# Patient Record
Sex: Male | Born: 2000 | Race: White | Hispanic: No | Marital: Single | Smoking: Never smoker
Health system: Southern US, Community
[De-identification: ages and names within clinical notes are randomized; demographics above are authoritative.]

## PROBLEM LIST (undated history)

## (undated) DIAGNOSIS — F32A Depression, unspecified: Secondary | ICD-10-CM

## (undated) DIAGNOSIS — R7303 Prediabetes: Secondary | ICD-10-CM

## (undated) DIAGNOSIS — F840 Autistic disorder: Secondary | ICD-10-CM

## (undated) DIAGNOSIS — F419 Anxiety disorder, unspecified: Secondary | ICD-10-CM

## (undated) DIAGNOSIS — R569 Unspecified convulsions: Secondary | ICD-10-CM

## (undated) HISTORY — DX: Depression, unspecified: F32.A

## (undated) HISTORY — DX: Anxiety disorder, unspecified: F41.9

## (undated) HISTORY — PX: TURBINATE REDUCTION: SHX6157

---
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
## 2019-12-19 ENCOUNTER — Other Ambulatory Visit: Payer: Self-pay

## 2019-12-19 ENCOUNTER — Emergency Department (HOSPITAL_BASED_OUTPATIENT_CLINIC_OR_DEPARTMENT_OTHER): Payer: Medicaid Other

## 2019-12-19 ENCOUNTER — Encounter (HOSPITAL_BASED_OUTPATIENT_CLINIC_OR_DEPARTMENT_OTHER): Payer: Self-pay

## 2019-12-19 ENCOUNTER — Emergency Department (HOSPITAL_BASED_OUTPATIENT_CLINIC_OR_DEPARTMENT_OTHER)
Admission: EM | Admit: 2019-12-19 | Discharge: 2019-12-19 | Disposition: A | Discharge status: Home / Self Care | Payer: Medicaid Other | Attending: Emergency Medicine | Admitting: Emergency Medicine

## 2019-12-19 DIAGNOSIS — Z79899 Other long term (current) drug therapy: Secondary | ICD-10-CM | POA: Insufficient documentation

## 2019-12-19 DIAGNOSIS — F840 Autistic disorder: Secondary | ICD-10-CM | POA: Insufficient documentation

## 2019-12-19 DIAGNOSIS — R000 Tachycardia, unspecified: Secondary | ICD-10-CM | POA: Insufficient documentation

## 2019-12-19 HISTORY — DX: Unspecified convulsions: R56.9

## 2019-12-19 HISTORY — DX: Prediabetes: R73.03

## 2019-12-19 HISTORY — DX: Autistic disorder: F84.0

## 2019-12-19 LAB — CBC WITH DIFFERENTIAL/PLATELET
Abs Immature Granulocytes: 0.01 10*3/uL (ref 0.00–0.07)
Basophils Absolute: 0 10*3/uL (ref 0.0–0.1)
Basophils Relative: 0 %
Eosinophils Absolute: 0.1 10*3/uL (ref 0.0–0.5)
Eosinophils Relative: 1 %
HCT: 44.3 % (ref 39.0–52.0)
Hemoglobin: 14.4 g/dL (ref 13.0–17.0)
Immature Granulocytes: 0 %
Lymphocytes Relative: 37 %
Lymphs Abs: 2.4 10*3/uL (ref 0.7–4.0)
MCH: 29.4 pg (ref 26.0–34.0)
MCHC: 32.5 g/dL (ref 30.0–36.0)
MCV: 90.4 fL (ref 80.0–100.0)
Monocytes Absolute: 0.5 10*3/uL (ref 0.1–1.0)
Monocytes Relative: 7 %
Neutro Abs: 3.5 10*3/uL (ref 1.7–7.7)
Neutrophils Relative %: 55 %
Platelets: 281 10*3/uL (ref 150–400)
RBC: 4.9 MIL/uL (ref 4.22–5.81)
RDW: 13.1 % (ref 11.5–15.5)
WBC: 6.5 10*3/uL (ref 4.0–10.5)
nRBC: 0 % (ref 0.0–0.2)

## 2019-12-19 LAB — COMPREHENSIVE METABOLIC PANEL
ALT: 27 U/L (ref 0–44)
AST: 17 U/L (ref 15–41)
Albumin: 4.4 g/dL (ref 3.5–5.0)
Alkaline Phosphatase: 72 U/L (ref 38–126)
Anion gap: 9 (ref 5–15)
BUN: 15 mg/dL (ref 6–20)
CO2: 23 mmol/L (ref 22–32)
Calcium: 8.7 mg/dL — ABNORMAL LOW (ref 8.9–10.3)
Chloride: 107 mmol/L (ref 98–111)
Creatinine, Ser: 1.04 mg/dL (ref 0.61–1.24)
GFR calc Af Amer: >60 mL/min (ref >60)
GFR calc non Af Amer: >60 mL/min (ref >60)
Glucose, Bld: 94 mg/dL (ref 70–99)
Potassium: 3.7 mmol/L (ref 3.5–5.1)
Sodium: 139 mmol/L (ref 135–145)
Total Bilirubin: 0.4 mg/dL (ref 0.3–1.2)
Total Protein: 7.3 g/dL (ref 6.5–8.1)

## 2019-12-19 LAB — TSH: TSH: 2.195 u[IU]/mL (ref 0.350–4.500)

## 2019-12-19 MED ORDER — MIDAZOLAM 5 MG/ML PEDIATRIC INJ FOR INTRANASAL/SUBLINGUAL USE
10.0000 mg | Freq: Once | INTRAMUSCULAR | Status: AC
  Administered 2019-12-19: 10 mg via NASAL
  Filled 2019-12-19 (×2): qty 2

## 2019-12-19 NOTE — Discharge Instructions (Signed)
Your work-up today was overall reassuring.  There were no significant lab abnormalities.  His TSH is pending, this can be checked via MyChart.  Please follow-up with cardiology for your son's tachycardia.  Return to the ER if his symptoms worsen.

## 2019-12-19 NOTE — ED Triage Notes (Addendum)
Per parents pt with "tachycardia" x 2 weeks-pt seen by PCP x once and virtual visit today and advised to come to ED-pt took propranolol 10mg  today at 745 with HR 118-pt NAD-steady gait-pt is autistic-parents states he can not answer if he is having pain

## 2019-12-19 NOTE — ED Provider Notes (Signed)
[facility] EMERGENCY DEPARTMENT Provider Note   CSN: [id number] Arrival date & time: 12/19/19  1121     History Chief Complaint  Patient presents with  . Tachycardia    Brian Huerta is a 19 y.o. male.  HPI  LEVEL 5 CAVEAT 2/2 TO AUTISM 19 year old male with a history of autism, seizures, prediabetes presents to the ER with episodes of tachycardia.  History provided by patient, patient is severely autistic, with behavioral levels of a 41-year-old per patient family.  They state that he has had intermittent episodes of tachycardia for the last 2 weeks.  At times, they believe that this is psychosomatic and is aggravated by his anger.  However, yesterday morning, the patient was calm and his father noted that he had an episode of tachycardia with his heart rate in the 120s.  He was nondiaphoretic, not short of breath, not ill-appearing at that time.  They followed up with their PCP, and were told to come to the ER for further work-up.  Patient has a prescription for propranolol, which she has been taking, and this has been helping improve his tachycardia.  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  He was not hospitalized for this, as the family is aware of his seizure history, and he did not lose consciousness.  He is followed by neurology, though he is in the middle of transitioning to another neurologist.  He is on Neurontin, Lamictal, and Topamax for seizures.  He also has a pending cardiology follow-up.  Family denies any fevers, nausea, vomiting, abdominal pain, diaphoresis, diarrhea, constipation.  The patient is severely autistic, and cannot often communicate how he feels however.  Patient has a history of aggression and is taking Zyprexa and Risperdal for this.    Past Medical History:  Diagnosis Date  . Autism   . Prediabetes   . Seizures  [facility])     Patient Active Problem List   Diagnosis Date Noted  . Undersocialized conduct disorder, aggressive type, unspecified 09/30/2012  . Autistic disorder, current or active state 09/30/2012  . Morbid obesity ([place]) 09/30/2012  . Intermittent explosive disorder 09/30/2012    Past Surgical History:  Procedure Laterality Date  . TURBINATE REDUCTION         No family history on file.  Social History   Tobacco Use  . Smoking status: Not on file  Substance Use Topics  . Alcohol use: Not on file  . Drug use: Not on file    Home Medications Prior to Admission medications   Medication Sig Start Date End Date Taking? Authorizing Provider  clomiPRAMINE (ANAFRANIL) 75 MG capsule TAKE 2 CAPSULES BY MOUTH ONCE DAILY IN THE MORNING AND 1 AT BEDTIME FOR SEVERE OCD 11/11/19   [provider]  cloNIDine (CATAPRES) 0.2 MG tablet Take 0.2 mg by mouth 2 (two) times daily. 11/11/19   [provider]  gabapentin (NEURONTIN) 600 MG tablet TAKE 1 TABLET BY MOUTH THREE TIMES DAILY (6 AM 10 AM AND 12 30 PM) FOR MOOD STABILIZATION 12/09/19   [provider]  lamoTRIgine (LAMICTAL) 25 MG tablet TAKE 1 TABLET BY MOUTH ONCE DAILY AT BEDTIME. INCREASE BY 1 TABLET WEEKLY UNTIL TAKING 4 TABLETS AT BEDTIME 11/21/19   [provider]  metFORMIN (GLUCOPHAGE) 500 MG tablet Take 500 mg by mouth daily. 11/11/19   [provider]  OLANZapine zydis (ZYPREXA) 10 MG disintegrating tablet Take 10 mg by mouth daily as needed. 11/14/19   [provider]  propranolol (INDERAL) 10 MG tablet Take 10 mg by mouth 2 (two) times daily as needed. 12/13/19   [provider]  risperiDONE (RISPERDAL) 3 MG tablet Take 3 mg by mouth 2 (two) times daily. 12/16/19   [provider]  topiramate (TOPAMAX) 200 MG tablet Take 200 mg by mouth 2 (two) times daily. 11/11/19   [provider]    Allergies    Patient has no known allergies.  Review of Systems     Review of Systems  Unable to perform ROS: Psychiatric disorder    Physical Exam Updated Vital Signs BP 123/86 (BP Location: Right Arm)   Pulse 93   Temp 98.3 F (36.8 C) (Tympanic)   Resp 16   Ht 5' 10.5" (1.791 m)   Wt 121.6 kg   SpO2 99%   BMI 37.91 kg/m   Physical Exam Vitals and nursing note reviewed.  Constitutional:      General: He is not in acute distress.    Appearance: Normal appearance. He is well-developed. He is not toxic-appearing or diaphoretic.  HENT:     Head: Normocephalic and atraumatic.     Nose: Nose normal.     Mouth/Throat:     Mouth: Mucous membranes are moist.     Pharynx: Oropharynx is clear.  Eyes:     Conjunctiva/sclera: Conjunctivae normal.     Pupils: Pupils are equal, round, and reactive to light.  Cardiovascular:     Rate and Rhythm: Normal rate and regular rhythm.     Pulses: Normal pulses.     Heart sounds: Normal heart sounds. No murmur.  Pulmonary:     Effort: Pulmonary effort is normal. No respiratory distress.     Breath sounds: Normal breath sounds.  Abdominal:     General: Abdomen is flat.     Palpations: Abdomen is soft.     Tenderness: There is no abdominal tenderness.  Musculoskeletal:        General: No tenderness or signs of injury. Normal range of motion.     Cervical back: Normal range of motion and neck supple. No rigidity or tenderness.  Skin:    General: Skin is warm and dry.  Neurological:     General: No focal deficit present.     Mental Status: He is alert and oriented to person, place, and time.     Sensory: No sensory deficit.     Motor: No weakness.  Psychiatric:        Mood and Affect: Mood normal.        Behavior: Behavior normal.     ED Results / Procedures / Treatments   Labs (all labs ordered are listed, but only abnormal results are displayed) Labs Reviewed  COMPREHENSIVE METABOLIC PANEL - Abnormal; Notable for the following components:      Result Value   Calcium 8.7 (*)    All other  components within normal limits  CBC WITH DIFFERENTIAL/PLATELET  TSH    EKG EKG Interpretation  Date/Time:  Thursday December 19 2019 11:31:20 EDT Ventricular Rate:  108 PR Interval:  138 QRS Duration: 80 QT Interval:  322 QTC Calculation: 431 R Axis:   32 Text Interpretation: Sinus tachycardia Otherwise normal ECG NO prior ECG for comparison. No STEMI Confirmed by Theda Belfast ([id number]) on 12/19/2019 1:45:35 PM   Radiology DG Chest [facility]  Result Date: 12/19/2019 CLINICAL DATA:  Tachycardia EXAM: PORTABLE CHEST 1 VIEW COMPARISON:  None. FINDINGS: The heart size and mediastinal contours are within normal limits. Both lungs are clear. The visualized skeletal structures are unremarkable. IMPRESSION: Normal study. Electronically Signed   By: Charlett Nose M.D.   On: 12/19/2019 17:42    Procedures Procedures (including critical care time)  Medications Ordered in ED Medications  midazolam (VERSED) 5 mg/ml Pediatric INJ for INTRANASAL Use (10 mg Nasal Given 12/19/19 1647)    ED Course  I have reviewed the triage vital signs and the nursing notes.  Pertinent labs & imaging results that were available during my care of the patient were reviewed by me and considered in my medical decision making (see chart for details).    MDM Rules/Calculators/A&P                     19 year old autistic male with intermittent tachycardia over the last 2 weeks. On presentation to the ER, the patient is alert and oriented, nontoxic-appearing, resting comfortably in the ER bed.  He is able to utter sporadic random words without increased work of breathing.  He has nondiaphoretic.  Vitals are overall reassuring, though while I was in the room, his heart rate does fluctuate from the 80s to the upper 120s, depending if he gets agitated or not.  Physical exam without abnormalities, no murmur appreciated.  The patient was sent here from primary care for further work-up.  I was informed by the nursing staff and  the patient's family that he becomes extremely combative with any attempts at getting lab work.  The patient's EKG on presentation showed normal sinus tach.  He is overall not ill-appearing, vitals are reassuring, EKG without ST abnormalities or evidence of ischemia.  We discussed at length about the risks and benefits of sedation in order to procure blood work from the patient.  I explained to the patient's family that we generally do not sedate for routine lab work in the ER.  The patient's father grew increasingly frustrated, and stated that he does not know where else he can get this blood work done.  He states that his PCP has refused to do lab work since the patient has a history of getting very violent.  He expresses frustrations over not knowing how to be able to get both blood work done for his son as has only had labs drawn once in his lifetime.   They are concerned about electrolyte abnormalities, or a thyroid issue as his mother has a history of this. His father related that the patient had been sedated once for tonsillectomy and adenoidectomy in July 2019.  Per chart review, patient received Versed then.   I relayed the message to Dr. Rush Landmark, who also consulted with Dr. Fredderick Phenix as this was at shift change.  They spoke with the patient's family at length, and decided to attempt to sedate him with Versed in the ER to procure lab work.  With this was successful, we were able to draw CBC, CMP, and TSH.  CBC without leukocytosis, normal hemoglobin.  CMP without significant electrolyte abnormalities, normal creatinine.  Normal AST ALT.  His TSH is pending.  Chest x-ray without acute abnormalities.  Overall his work-up is reassuring.  I informed the patient's of the reassuring lab work, and they are overall reassured by the work-up.  I encouraged them to keep their follow-up with the neurologist and cardiologist, follow-up with his PCP as well.  Encouraged to continue to take propranolol.  All of the patient's  parents questions have been answered to their satisfaction.  At this stage in the ED course, his work-up has been fairly benign.  I suspect his tachycardia could be secondary to agitation.  However, he will still need to follow-up with cardiology.  At this stage in the ED course, the patient is appropriately medically screened and are stable for discharge.  The patient was seen and evaluated by Dr. Rush Landmark and Dr. Fredderick Phenix and they are agreeable to the above plan.  Final Clinical Impression(s) / ED Diagnoses Final diagnoses:  Tachycardia    Rx / DC Orders ED Discharge Orders    None       Leone Brand 12/19/19 1838    Tegeler, Canary Brim, MD 12/19/19 2110

## 2019-12-27 ENCOUNTER — Encounter: Payer: Self-pay | Admitting: Cardiology

## 2019-12-27 ENCOUNTER — Ambulatory Visit: Payer: Medicaid Other | Admitting: Cardiology

## 2019-12-27 ENCOUNTER — Other Ambulatory Visit: Payer: Self-pay

## 2019-12-27 VITALS — BP 124/73 | HR 118 | Resp 17 | Ht 70.0 in | Wt 258.0 lb

## 2019-12-27 DIAGNOSIS — R000 Tachycardia, unspecified: Secondary | ICD-10-CM

## 2019-12-27 NOTE — Progress Notes (Signed)
Patient referred by Trey Sailors, PA for tachycrdia  Subjective:   Brian Huerta, male    DOB: May 07, 2001, 19 y.o.   MRN: [id number]   Chief Complaint  Patient presents with  . Tachycardia  . New Patient (Initial Visit)     HPI  19 y.o. Caucasian male with autism, mood disorders, recurrent seizures, referred for evaluation of tachycardia.  Patient is here with his father.  He lives with his father and his wife, with his biological mother lives separately.  He visits his mother once every other weekend.  He has been out of school throughout the pandemic, and is only resuming session next week at an inclusive school.  Throughout the pandemic, his father tells me that patient has had significant separation anxiety from his mother.  Throughout the visit today, he matters " my mom" several times.  In the last few weeks, he has had recurrent seizure episodes that have not completely resolved in spite of starting lamotrigine.  Father thinks that lamotrigine has led to more anxiety.  Father has not noted any significant change in his baseline physical activity, and has not noticed any significant shortness of breath.  He does have attacks where he becomes cold and clammy and very anxious, but his heart rate shoots up further.  Has not had any presyncope or syncope episode.  At night, his heart rate is down to 60 bpm, as measured on pulse oximeter.  He has not had any hypoxia.  Currently, he is prescribed propranolol 10 mg as needed, which he generally takes once a day.  He also takes clonidine 0.2 mg 1-2 times at night, as prescribed by his psychiatrist, which helps him stay calm and sleep better.  Father is concerned about myocarditis, as he thinks his symptoms have occurred more after his second [place] vaccine  Past Medical History:  Diagnosis Date  . Autism   . Prediabetes   . Seizures ([place])      Past Surgical History:  Procedure Laterality Date  . TURBINATE REDUCTION         Social History   Tobacco Use  Smoking Status Never Smoker  Smokeless Tobacco Never Used    Social History   Substance and Sexual Activity  Alcohol Use Not Currently     Family History  Problem Relation Age of Onset  . Hypothyroidism Mother   . Sleep apnea Father      Current Outpatient Medications on File Prior to Visit  Medication Sig Dispense Refill  . clomiPRAMINE (ANAFRANIL) 75 MG capsule TAKE 2 CAPSULES BY MOUTH ONCE DAILY IN THE MORNING AND 1 AT BEDTIME FOR SEVERE OCD    . cloNIDine (CATAPRES) 0.2 MG tablet Take 0.2 mg by mouth 2 (two) times daily.    Marland Kitchen gabapentin (NEURONTIN) 600 MG tablet TAKE 1 TABLET BY MOUTH THREE TIMES DAILY (6 AM 10 AM AND 12 30 PM) FOR MOOD STABILIZATION    . lamoTRIgine (LAMICTAL) 25 MG tablet TAKE 1 TABLET BY MOUTH ONCE DAILY AT BEDTIME. INCREASE BY 1 TABLET WEEKLY UNTIL TAKING 4 TABLETS AT BEDTIME    . metFORMIN (GLUCOPHAGE) 500 MG tablet Take 500 mg by mouth daily.    Marland Kitchen OLANZapine zydis (ZYPREXA) 10 MG disintegrating tablet Take 10 mg by mouth daily as needed.    . propranolol (INDERAL) 10 MG tablet Take 10 mg by mouth 2 (two) times daily as needed.    . risperiDONE (RISPERDAL) 3 MG tablet Take 3 mg by mouth 2 (  two) times daily.    Marland Kitchen topiramate (TOPAMAX) 200 MG tablet Take 200 mg by mouth 2 (two) times daily.     No current facility-administered medications on file prior to visit.    Cardiovascular and other pertinent studies:   EKG 12/19/2019: Sinus tachycardia 108 bpm. Otherwise normal EKG   Recent labs: 12/19/2019: Glucose 94, BUN/Cr 15/1.04. EGFR >60. Na/K 139/3.7. Rest of the CMP normal H/H 14/44. MCV 90. Platelets 281 HbA1C N/A% TSH 2.1 normal   Review of Systems  Unable to perform ROS: patient nonverbal         Vitals:   12/27/19 0917 12/27/19 0918  BP:    Pulse:    Resp:    SpO2: 97% 99%   Orthostatic VS for the past 72 hrs (Last 3 readings):  Orthostatic BP Patient Position BP Location Cuff Size  Orthostatic Pulse  12/27/19 0918 112/72 Standing Left Arm Large 121  12/27/19 0917 120/79 Sitting Left Arm Large 105  12/27/19 0916 105/76 Supine Left Arm Large 105     Body mass index is 37.02 kg/m. Filed Weights   12/27/19 0911  Weight: 258 lb (117 kg)     Objective:   Physical Exam Vitals and nursing note reviewed.  Constitutional:      General: He is not in acute distress. Neck:     Vascular: No JVD.  Cardiovascular:     Rate and Rhythm: Regular rhythm. Tachycardia present.     Pulses: Intact distal pulses.     Heart sounds: Normal heart sounds. No murmur heard.   Pulmonary:     Effort: Pulmonary effort is normal.     Breath sounds: Normal breath sounds. No wheezing or rales.          Assessment & Recommendations:   19 y.o. Caucasian male with autism, mood disorders, recurrent seizures, referred for evaluation of tachycardia.  Tachycardia: Normal physical exam, other than resting tachycardia.  Normal EKG.  No signs/symptoms to suggest congestive heart failure, myocarditis, pericarditis. I am convinced that his tachycardia is due to underlying anxiety, which has only worsened throughout the pandemic.  I do not think any cardiac testing will provide [place] any more meaningful information without causing patient more stress and discomfort. I recommend taking propranolol 10 mg 2-3 times daily to improve his resting inappropriate sinus tachycardia, rising from anxiety.  I do not think this will cause any significant drop in his blood pressure.  I will convey my recommendations to the patient's PCP, with whom he has a visit later this afternoon.  For the propranolol prescriptions can be managed by PCP.  Patient's father is reassured with my recommendation, and agrees with no further cardiac testing at this time.   I will see him on as-needed basis.  Time spent reviewing prior records, consultation and counseling, and charting: 60 min   Thank you for referring the  patient to [place]. Please feel free to contact with any questions.  Nigel Mormon, MD [facility]. [facility] Pager: [phone] Office: [phone]

## 2019-12-30 ENCOUNTER — Telehealth (INDEPENDENT_AMBULATORY_CARE_PROVIDER_SITE_OTHER): Payer: Self-pay | Admitting: Pediatrics

## 2019-12-30 NOTE — Telephone Encounter (Signed)
  Who's calling (name and relationship to patient) :Dad/ Allena Earing   Best contact number:[phone]  Provider they see:  Reason for call:Dad is requesting for Momin to see Dr. Artis Flock. The pt has seen Dr. Sharene Skeans and Inetta Fermo before but it has been 7 years. Dad also has other concerns about why he wants the pt do be seen by Dr. Artis Flock. Pt is 19 years old      PRESCRIPTION REFILL ONLY  Name of prescription:  Pharmacy:

## 2019-12-30 NOTE — Telephone Encounter (Signed)
Patient chart reviewed and patient discussed with Brian Huerta, clinical manager.  Will discuss appropriateness with clinic protocol on Thursday and return message to family afterwards.   Lorenz Coaster mD MPH

## 2020-01-08 NOTE — Telephone Encounter (Signed)
Patient discussed at meeting, agreed to see him as new patient.   Lorenz Coaster MD MPH

## 2020-01-28 ENCOUNTER — Telehealth (INDEPENDENT_AMBULATORY_CARE_PROVIDER_SITE_OTHER): Payer: Self-pay | Admitting: Pediatrics

## 2020-01-28 NOTE — Telephone Encounter (Signed)
Would you like them to contact their PCP since they have not been seen yet?

## 2020-01-28 NOTE — Telephone Encounter (Signed)
  Who's calling (name and relationship to patient) : Allena Earing (father)  Best contact number: [phone]  Provider they see: Dr. Artis Flock  Reason for call: Patient has his first appointment scheduled with Dr. Artis Flock on 7/28. Dad is calling today because patient had a significant grand mal seizure yesterday and dad is wanting advice on what to do. Also curious about seizure medication that comes in nasal spray form. Requests call back.    PRESCRIPTION REFILL ONLY  Name of prescription:  Pharmacy:

## 2020-01-29 NOTE — Telephone Encounter (Signed)
I can not give medical advice given he is not established with [place].  It appears he has a current neurologist through [facility], I recommend reaching out to them until he has established care with me.   Lorenz Coaster MD MPH

## 2020-01-29 NOTE — Telephone Encounter (Signed)
I called patient's father to relay message from Dr. Artis Flock. He stated that he understood the reasoning, however, he was not going to call [facility] neurology as it would be taking steps back in Andreas's care. He feels he did not receive appropriate care at [facility] and the prescribing of lamotrigine as he believes it caused it psychosomatic reaction causing his anxiety and irritability to increase. He has made attempts to call PCP and they refer him back to "seeing a neurologist" and state that they are not equipped to take care of Doyel's neurological issues.   Father reports hope in [place] not having more seizures from now until appointment date. He believes that seizure activity has increased as of April 17th when [facility] had his second Covid-19 [place]) vaccine.   He states that the seizure Deante had on Monday lasted from 11:55am-12:02pm. There were no rescue medications given as he does not have any prescribed to him. Kyland was unconscious and cyanosis of the lips (this has only ever happened one other time) for about one minute after seizure activity stopped. Father states that what scared him was that post seizure Karol staggered while walking, which he normally does not do, he also fell asleep for longer than usual. While sleeping father noticed him having "petite mal seizures," side to side eye movements, and muscle spasms. He slept until about 3pm that day.   I let father know I would reach out to him if there were any available slots that came up tomorrow or Friday, or if Dr. Artis Flock could fit him in but I could not guarantee this. I invited him to call me back if there was further seizure activity so that I could have documentation of this. Father verbalized agreement and understanding.

## 2020-01-29 NOTE — Telephone Encounter (Signed)
I can not give medical advice if he hasn't established with me, however I can add him on to my schedule tomorrow at 9am if father can do it. I'm out of town next week, so unfortunately if 9am tomorrow does not work, he will have to wait until 7/28.   Lorenz Coaster MD MPH

## 2020-01-30 NOTE — Telephone Encounter (Signed)
Patient scheduled on 7/19 with Elveria Rising, FNP

## 2020-02-03 ENCOUNTER — Encounter (INDEPENDENT_AMBULATORY_CARE_PROVIDER_SITE_OTHER): Payer: Self-pay | Admitting: Family

## 2020-02-03 ENCOUNTER — Ambulatory Visit (INDEPENDENT_AMBULATORY_CARE_PROVIDER_SITE_OTHER): Payer: Medicaid Other | Admitting: Family

## 2020-02-03 ENCOUNTER — Other Ambulatory Visit: Payer: Self-pay

## 2020-02-03 VITALS — BP 110/68 | Ht 70.67 in | Wt 262.5 lb

## 2020-02-03 DIAGNOSIS — R569 Unspecified convulsions: Secondary | ICD-10-CM | POA: Diagnosis not present

## 2020-02-03 MED ORDER — VALTOCO 20 MG DOSE 10 MG/0.1ML NA LQPK: NASAL | 1 refills | Status: DC

## 2020-02-03 NOTE — Patient Instructions (Signed)
Thank you for coming in today.   Instructions for you until your next appointment are as follows: 1. Please try to video any seizures Brian Huerta may have 2. If he has a seizure lasting 2 minutes or longer, give Valtoco nasal spray. He should receive 1 spray into each nostril. The package comes with 2 atomizers in order to give the sprays.  3. Brian Huerta may be tired or sleepy after the Valtoco. It is ok to let him sleep.  4. He may have a headache after a seizure. It is ok to give him Tylenol or Motrin after a seizure occurs 5. I have completed a school form for the Valtoco.  6. If you or the school has to give the medication, please let me know.  7. Please keep the appointment with Dr Artis Flock next week 8. Please sign up for MyChart if you have not done so

## 2020-02-04 ENCOUNTER — Telehealth (INDEPENDENT_AMBULATORY_CARE_PROVIDER_SITE_OTHER): Payer: Self-pay | Admitting: Family

## 2020-02-04 ENCOUNTER — Encounter (INDEPENDENT_AMBULATORY_CARE_PROVIDER_SITE_OTHER): Payer: Self-pay | Admitting: Family

## 2020-02-04 NOTE — Progress Notes (Signed)
Brian Huerta   MRN:  [id number]  10-Nov-2000   Provider: Elveria Rising NP-C Location of Care: [facility]  Visit type: New patient   Referral source: Brian Riffle, PA/Brian Osei-Bonsu, MD History from: Patient's parents  Brief history:  Brian Huerta has history of autism, problems with behaviors, morbid obesity and seizure disorder. He was seen as a young child by me and by Brian Huerta but those records are in storage and not available to me at this time. He receives psychiatric care from Brian Huerta. Parents report that he is taking Topiramate 200mg  twice per day for weight loss and presumed "petit mal" seizures in which he stares, as random eye movements and he is dazed and angry afterwards. He taking Gabapentin 600mg  three times per day for behavior and seizures. He was started on Lamotrigine in May 2021 by Brian at [facility], but the dose has been reduced to 25mg  at bedtime because of increased anxiety since the medication was started. Parents report general increase in anxiety and panic behaviors. He was seen in the ER on December 19, 2019 for tachycardia thought to be secondary to agitation and then seen in follow up by cardiology. Propranolol was started at 10mg  three times per day for this problem. Parents report that he has ongoing problems with oppositional behavior, intermittent explosive behavior, compulsive behavior and intermittent aggressive behaviors.   Brian Huerta also takes Clomipramine and Risperidone for mood disorder and behaviors, Clonidine for insomnia and Metformin for concern for pre-diabetes.   Brian Huerta is seen today because of increase in convulsive seizures. Parents report that the first seizure occurred in 2019 when he had a 30 second generalized seizure in school. He did well until April 2021 when he was found on the floor and had a 2 minute convulsive seizure with 30 seconds of apnea. A third seizure occurred on Nov 21, 2019.  This occurred in the late afternoon, and lasted 3-4 minutes with 45 seconds of apnea and cyanosis to the lips. Of note it was the first day of in classroom school after the Covid 19 pandemic restrictions, and he also seemed overheated at the time of the seizure.   The next seizure occurred December 17, 2019. He was walking at school when he had seizure lasting 2 minutes 45 seconds. He had been suffering with increased anxiety and panic, that his parents felt were worsened by Lamotrigine.   The most recent seizure occurred January 27, 2020 when he had a 6 minute convulsive seizure at school. He was sitting at the time and was not injured with the seizure. He had perioral cyanosis with this seizure. Afterwards he slept excessively for 1+1/2 days, would flinch when his body was touched and placed his hand to his head indicating a headache.   Brian Huerta is largely non-verbal but has some signs that he uses to communicate when prompted. He is morbidly obese but parents note that he has lost about 20 lbs since being on Topiramate. He is a generally picky eater but has been consuming a more varied diet as he gets older. His parents say that he drinks water every day.   Parents report that Brian Huerta did well with remote school last year. His parents have shared custody and report that his behaviors also improved when he had more time with Mom due to remote learning.  Parents are concerned about the increase in seizures and are interested in an abortive treatment. He has an appointment with Brian  Brian Huerta next week to establish neurology care. He has not had an EEG performed as he is unable to cooperate with the procedure.  Brian Huerta has been otherwise generally healthy. Parents have no other health concerns for him today other than previously mentioned.  Review of systems: Please see HPI for neurologic and other pertinent review of systems. Otherwise all other systems were reviewed and were negative.  Problem List: Patient  Active Problem List   Diagnosis Date Noted   Undersocialized conduct disorder, aggressive type, unspecified 09/30/2012   Autistic disorder, current or active state 09/30/2012   Morbid obesity (HCC) 09/30/2012   Intermittent explosive disorder 09/30/2012     Past Medical History:  Diagnosis Date   Anxiety    Phreesia 02/02/2020   Autism    Depression    Phreesia 02/02/2020   Prediabetes    Seizures (HCC)     Past medical history comments: See HPI  Surgical history: Past Surgical History:  Procedure Laterality Date   TURBINATE REDUCTION       Family history: family history includes Hypothyroidism in his mother; Sleep apnea in his father.   Social history: Social History   Socioeconomic History   Marital status: Single    Spouse name: Not on file   Number of children: 0   Years of education: Not on file   Highest education level: Not on file  Occupational History   Not on file  Tobacco Use   Smoking status: Passive Smoke Exposure - Never Smoker   Smokeless tobacco: Never Used  Vaping Use   Vaping Use: Never used  Substance and Sexual Activity   Alcohol use: Not Currently   Drug use: Not Currently   Sexual activity: Not on file  Other Topics Concern   Not on file  Social History Narrative   Brian Huerta, is currently in school at [employer]. He is currently on the completion track. He lives with Dad during the school year. He has 1 older brother.   Social Determinants of Health   Financial Resource Strain:    Difficulty of Paying Living Expenses:   Food Insecurity:    Worried About Programme researcher, broadcasting/film/video in the Last Year:    Barista in the Last Year:   Transportation Needs:    Freight forwarder (Medical):    Lack of Transportation (Non-Medical):   Physical Activity:    Days of Exercise per Week:    Minutes of Exercise per Session:   Stress:    Feeling of Stress :   Social Connections:    Frequency of Communication  with Friends and Family:    Frequency of Social Gatherings with Friends and Family:    Attends Religious Services:    Active Member of Clubs or Organizations:    Attends Engineer, structural:    Marital Status:   Intimate Partner Violence:    Fear of Current or Ex-Partner:    Emotionally Abused:    Physically Abused:    Sexually Abused:      Past/failed meds: Lamotrigine - increased anxiety Hydroxyzine - paradoxical effect Olanzapine - paradoxical effect  Allergies: No Known Allergies    Immunizations:  There is no immunization history on file for this patient.    Diagnostics/Screenings:   Physical Exam: BP 110/68    Ht 5' 10.67" (1.795 m)    Wt 262 lb 8 oz (119.1 kg)    HC 62" (157.5 cm)    BMI 36.96 kg/m   General:  well developed, well nourished obese young man, pacing in exam room, in no evident distress Head: normocephalic and atraumatic. Oropharynx examination deferred due to patient's inability to cooperate. No dysmorphic features. Neck: supple Cardiovascular: regular rate and rhythm, no murmurs. Respiratory: Clear to auscultation bilaterally Abdomen: Bowel sounds present all four quadrants, abdomen soft, non-tender, non-distended.  Musculoskeletal: No skeletal deformities or obvious scoliosis Skin: no rashes or neurocutaneous lesions  Neurologic Exam Mental Status: Awake and fully alert.  Attention span, concentration, and fund of knowledge subnormal for age.  Has no language other than saying "mama". He signed "thank you" to me with parent's prompting. Had occasionally vocalizations. Able to follow a very few basic commands. Restless and pacing in the exam room for most of the visit.  Cranial Nerves: Fundoscopic exam - red reflex present.  Unable to fully visualize fundus.  Pupils equal briskly reactive to light.  Extraocular movements appear full without nystagmus. Hearing intact and symmetric to mother's whisper.  Facial sensation intact.  Face,  tongue, palate move normally and symmetrically.  Neck flexion and extension normal. Motor: Normal functional bulk, tone and strength Sensory: Intact to touch and temperature in all extremities. Coordination: Unable to adequately assess due to patient's inability to cooperate with examination. No dysmetria when reaching for objects. Balance is adequate.  Gait and Station: Arises from chair, without difficulty. Stance is normal.  Gait demonstrates wide based stride length and balance.  Reflexes: Unable to adequately assess due to patient's inability to cooperate.  Impression: 1. Autism 2. Mood disorder and problems with behavior 3. Seizure disorder 4. Morbid obesity  Recommendations for plan of care: [facility]. Olena LeatherwoodJarrett is a 19 year old man with history of autism, mood disorder and problems with behavior, seizure disorder and morbid obesity. He was referred for evaluation and management of seizures. Olena LeatherwoodJarrett has been prescribed Topiramate for weight loss and Gabapentin for behavior, and parents presume that they are also utilized for the seizure disorder as they report "petit mal" seizures for several years. Olena LeatherwoodJarrett was prescribed Lamotrigine in May 2021 by Brian Alton [facility]aru [facility] but his parents report that this medication worsened anxiety and panic behaviors. He has had total of 6 witnessed convulsive seizures, with the first occurring in 2019 and the last 5 occurring between April and July of this year. His parents are appropriately concerned about the increase in seizures and are interested in abortive treatment. I reviewed options for abortive treatment such as rectal Diastat, Valtoco nasal spray and Nayzilam nasal spray. The rectal Diastat would be very difficult in view of his morbid obesity and getting him in to position to administer the medication. After discussion, his parents agreed to trial of Valtoco nasal spray. I reviewed the medication with his parents  and demonstrated how to administer it. I completed a school medication form for Olena LeatherwoodJarrett to receive the medication at school when seizures occur. I asked his parents to try to video any seizure events as it is unlikely that he would be able to tolerate an EEG.  I reviewed common seizure triggers such as sleep deprivation and emotional upset as well as seizure first aid and treatment after a seizure occurs. Olena LeatherwoodJarrett has an appointment next week with Brian Lorenz CoasterStephanie Brian Huerta for further evaluation and I encouraged his parents to keep that appointment. Parents agreed with plans made today.   The medication list was reviewed and reconciled. I reviewed changes that were made in the prescribed medications today. A complete medication list  was provided to the patient.  Allergies as of 02/03/2020   No Known Allergies     Medication List       Accurate as of February 03, 2020 11:59 PM. If you have any questions, ask your nurse or doctor.        clomiPRAMINE 75 MG capsule Commonly known as: ANAFRANIL TAKE 2 CAPSULES BY MOUTH ONCE DAILY IN THE MORNING AND 1 AT BEDTIME FOR SEVERE OCD   cloNIDine 0.2 MG tablet Commonly known as: CATAPRES Take 0.2 mg by mouth 2 (two) times daily.   gabapentin 600 MG tablet Commonly known as: NEURONTIN TAKE 1 TABLET BY MOUTH THREE TIMES DAILY (6 AM 10 AM AND 12 30 PM) FOR MOOD STABILIZATION   lamoTRIgine 25 MG tablet Commonly known as: LAMICTAL TAKE 1 TABLET BY MOUTH ONCE DAILY AT BEDTIME. INCREASE BY 1 TABLET WEEKLY UNTIL TAKING 4 TABLETS AT BEDTIME   metFORMIN 500 MG tablet Commonly known as: GLUCOPHAGE Take 500 mg by mouth daily.   OLANZapine zydis 10 MG disintegrating tablet Commonly known as: ZYPREXA Take 10 mg by mouth daily as needed.   propranolol 10 MG tablet Commonly known as: INDERAL Take 10 mg by mouth 2 (two) times daily as needed.   risperiDONE 3 MG tablet Commonly known as: RISPERDAL Take 3 mg by mouth 2 (two) times daily.   topiramate 200 MG  tablet Commonly known as: TOPAMAX Take 200 mg by mouth 2 (two) times daily.   Valtoco 20 MG Dose 2 x 10 MG/0.1ML Lqpk Generic drug: diazePAM (20 MG Dose) Place 1 spray into each nostril for seizure lasting more than 2 minutes Started by: Brian Rising, NP       Total time spent with the patient was 35 minutes, of which 50% or more was spent in counseling and coordination of care.  Brian Rising NP-C [facility] Ph. [phone] Fax [phone]

## 2020-02-04 NOTE — Telephone Encounter (Signed)
I called the pharmacy to let them know that the Valtoco was approved by Medicaid. The pharmacy had been running it as 10mg  dose when it is 20mg  dose. The pharmacy said that it went through as 20mg  dose. I called Dad to let him know. TG

## 2020-02-04 NOTE — Telephone Encounter (Signed)
Who's calling (name and relationship to patient) : Renae Fickle [name] dad  Best contact number: [phone]  Provider they see: Elveria Rising  Reason for call: Dad called stating that the insurance would not cover valtoco They would cover a generic though  Call ID:      PRESCRIPTION REFILL ONLY  Name of prescription:  Pharmacy:

## 2020-02-12 ENCOUNTER — Ambulatory Visit (INDEPENDENT_AMBULATORY_CARE_PROVIDER_SITE_OTHER): Payer: Medicaid Other | Admitting: Pediatrics

## 2020-02-12 ENCOUNTER — Encounter (INDEPENDENT_AMBULATORY_CARE_PROVIDER_SITE_OTHER): Payer: Self-pay | Admitting: Pediatrics

## 2020-02-12 VITALS — BP 108/62 | HR 80 | Ht 70.0 in | Wt 262.0 lb

## 2020-02-12 DIAGNOSIS — F840 Autistic disorder: Secondary | ICD-10-CM

## 2020-02-12 DIAGNOSIS — R569 Unspecified convulsions: Secondary | ICD-10-CM

## 2020-02-12 MED ORDER — LACOSAMIDE 100 MG PO TABS: 100.0000 mg | ORAL_TABLET | Freq: Two times a day (BID) | ORAL | 3 refills | Status: DC

## 2020-02-12 MED ORDER — TOPIRAMATE 200 MG PO TABS: 200.0000 mg | ORAL_TABLET | Freq: Two times a day (BID) | ORAL | 3 refills | Status: DC

## 2020-02-12 MED ORDER — GABAPENTIN 600 MG PO TABS: 600.0000 mg | ORAL_TABLET | Freq: Four times a day (QID) | ORAL | 3 refills | Status: DC

## 2020-02-12 NOTE — Patient Instructions (Addendum)
Continue Topamax, I have taken over the prescriptions  Increase gabapentin to 600mg  4 times daily, every day  Start Vimpat (lamotrigine) 100mg  twice daily once you are comfortable with increased gabapentin (1-2 weeks)   Please sign release of information for Dr .  I will contact him.    Follow-up in 1 month Lacosamide tablets What is this medicine? LACOSAMIDE (la KOE sa mide) is used to control seizures caused by certain types of epilepsy. This medicine may be used for other purposes; ask your health care provider or pharmacist if you have questions. COMMON BRAND NAME(S): Vimpat What should I tell my health care provider before I take this medicine? They need to know if you have any of these conditions:  drug abuse or addiction  heart disease  kidney disease  liver disease  suicidal thoughts, plans, or attempt; a previous suicide attempt by you or a family member  an unusual or allergic reaction to lacosamide, other medicines, foods, dyes, or preservatives  pregnant or trying to get pregnant  breast-feeding How should I use this medicine? Take this medicine by mouth with a glass of water. Follow the directions on the prescription label. Do not cut, crush or chew this medicine. Swallow tablets whole. You can take it with or without food. If it upsets your stomach, take it with food. Take your medicine at regular intervals. Do not take it more often than directed. Do not stop taking except on your doctor's advice. A special MedGuide will be given to you by the pharmacist with each prescription and refill. Be sure to read this information carefully each time. Talk to your pediatrician regarding the use of this medicine in children. While this drug may be prescribed for children as young as 78 years of age for selected conditions, precautions do apply. Overdosage: If you think you have taken too much of this medicine contact a poison control center or emergency room at  once. NOTE: This medicine is only for you. Do not share this medicine with others. What if I miss a dose? If you miss a dose, take it as soon as you can. If it is almost time for your next dose, take only that dose. Do not take double or extra doses. What may interact with this medicine? This medicine may interact with the following medications:  atazanavir  beta-blockers like metoprolol and propranolol  calcium channel blockers like diltiazem and verapamil  certain medicines for irregular heart beat like amiodarone, bepridil, dofetilide, encainide, flecainide, propafenone, quinidine  certain medicines for seizures like carbamazepine, phenytoin  digoxin  dronedarone  lopinavir/ritonavir This list may not describe all possible interactions. Give your health care provider a list of all the medicines, herbs, non-prescription drugs, or dietary supplements you use. Also tell them if you smoke, drink alcohol, or use illegal drugs. Some items may interact with your medicine. What should I watch for while using this medicine? Visit your doctor or health care provider for regular checks on your progress. This medicine needs careful monitoring. This medicine may cause serious skin reactions. They can happen weeks to months after starting the medicine. Contact your health care provider right away if you notice fevers or flu-like symptoms with a rash. The rash may be red or purple and then turn into blisters or peeling of the skin. Or, you might notice a red rash with swelling of the face, lips or lymph nodes in your neck or under your arms. Wear a medical ID bracelet or chain, and  carry a card that describes your disease and details of your medicine and dosage times. You may get drowsy or dizzy. Do not drive, use machinery, or do anything that needs mental alertness until you know how this medicine affects you. Do not stand or sit up quickly, especially if you are an older patient. This reduces the  risk of dizzy or fainting spells. Alcohol may interfere with the effect of this medicine. Avoid alcoholic drinks. The use of this medicine may increase the chance of suicidal thoughts or actions. Pay special attention to how you are responding while on this medicine. Any worsening of mood, or thoughts of suicide or dying should be reported to your health care provider right away. Women who become pregnant while using this medicine may enroll in the [place] American Antiepileptic Drug Pregnancy Registry by calling 501-395-1994. This registry collects information about the safety of antiepileptic drug use during pregnancy. What side effects may I notice from receiving this medicine? Side effects that you should report to your doctor or health care professional as soon as possible:  allergic reactions like skin rash, itching or hives, swelling of the face, lips, or tongue  rash, fever, and swollen lymph nodes  signs and symptoms of a dangerous change in heartbeat or heart rhythm like chest pain; dizziness; fast, irregular heartbeat; palpitations; feeling faint or lightheaded; falls; breathing problems  signs and symptoms of liver injury like dark yellow or brown urine; general ill feeling or flu-like symptoms; light-colored stools; loss of appetite; nausea; right upper belly pain; unusually weak or tired; yellowing of the eyes or skin  suicidal thoughts, mood changes Side effects that usually do not require medical attention (report to your doctor or health care professional if they continue or are bothersome):  blurred vision  dizziness  drowsiness  headache  nausea This list may not describe all possible side effects. Call your doctor for medical advice about side effects. You may report side effects to FDA at 1-800-FDA-1088. Where should I keep my medicine? Keep out of the reach of children. This medicine can be abused. Keep your medicine in a safe place to protect it from theft. Do not  share this medicine with anyone. Selling or giving away this medicine is dangerous and against the law. This medicine may cause harm and death if it is taken by other adults, children, or pets. Return medicine that has not been used to an official disposal site. Contact the DEA at 937-134-7649 or your city/county government to find a site. If you cannot return the medicine, mix any unused medicine with a substance like cat litter or coffee grounds. Then throw the medicine away in a sealed container like a sealed bag or coffee can with a lid. Do not use the medicine after the expiration date. Store at room temperature between 15 and 30 degrees C (59 and 86 degrees F). NOTE: This sheet is a summary. It may not cover all possible information. If you have questions about this medicine, talk to your doctor, pharmacist, or health care provider.  2020 Elsevier/Gold Standard (2018-10-05 14:55:28)

## 2020-02-12 NOTE — Progress Notes (Addendum)
Patient: Brian Huerta MRN: [id number] Sex: male DOB: 11/16/2000  Provider: Lorenz Coaster, MD Location of Care: [facility]  Note type: Follow-up consultation  History of Present Illness: Referral Source: Norm Salt, PA History from: patient and prior records Chief Complaint: schedule intake for increasing seizures  Brian Huerta is a 19 y.o. male with history of autism, problems with behaviors, morbid obesity and seizure disorder  who I am seeing by the request of Norm Salt, [place] for consultation on concern of  increased seizures. Review of prior history shows patient was last seen by his PCP on 01/14/20 for T2DM.  No seizures mentioned at this visit.  Review of electronic chart shows patient previously seeing Dr Alton Revere,  Patient started on Lamictal 11/21/19. Father reporting increasing seizures since then with side effect to Lamictal, patient was seen by Elveria Rising NP in our office on 02/04/2020 where patient was prescribed Valtoco nasal spray as an abortive medication while awaiting inatke visit with myself.   Patient presents today with parents.  They report:    2014- Petit mal seizures, behavior.  Started on Topamax.  Started on gabapentin.  2019 grand mal seizure at lunch, shaking all over lasting 30 seconds.  Seen by Dr Alton Revere, kept him on his current medications.   April 2021.  Had mederna shot, parents worried that this exacerbated seizures.  Had 2 minute seizure.  Seen In April 27, started nothing.  Seen again in May, started lamictal.  Went up to 75mg  but felt it increased anxiety, started panic attacks, and tachycardia.   Most recent seizure July 12, having tremor 15 minutes after seizures.   afterwards head and muscles ached.  At this point weaned down to Lamictal 25mg  but have not stopped it.  He has not any clear seizures since then.  Monday, had event of mindless babbling and choreaform movements.   Cardiology  started propranolol 10mg  BID-TID.    He has "off and on" tremors of face.    Saw Dr at [facility], started on risperdal.  Then started with [facility], started 2019  Failed meds: zyprexa (paradoxical effect), ativan (paradoxical effect), valium (paradoxical effect).  Tried SSRIs, unsure what, "paradoxical response".   [name] every 2 months, not planning to change anything.    Past Medical History Past Medical History:  Diagnosis Date  . Anxiety    Phreesia 02/02/2020  . Autism   . Depression    Phreesia 02/02/2020  . Prediabetes   . Seizures [facility])     Surgical History Past Surgical History:  Procedure Laterality Date  . TURBINATE REDUCTION      Family History family history includes Anxiety disorder in his mother; Bipolar disorder in his brother; Depression in his mother; Hypothyroidism in his mother; Migraines in his maternal grandmother; Sleep apnea in his father.   Social History Social History   Social History Narrative   [name], is currently in school at 02/04/2020. He is currently on the completion track. He lives with Dad during the school year. He has 1 older brother and a sibling that was given up for adoption.    Allergies No Known Allergies  Medications Current Outpatient Medications on File Prior to Visit  Medication Sig Dispense Refill  . clomiPRAMINE (ANAFRANIL) 75 MG capsule TAKE 2 CAPSULES BY MOUTH ONCE DAILY IN THE MORNING AND 1 AT BEDTIME FOR SEVERE OCD    . cloNIDine (CATAPRES) 0.2 MG tablet Take 0.2 mg  by mouth 2 (two) times daily. Bedtime only (8:15p) if not resting within an hour then he gets second dose.    . lamoTRIgine (LAMICTAL) 25 MG tablet TAKE 1 TABLET BY MOUTH ONCE DAILY AT BEDTIME. INCREASE BY 1 TABLET WEEKLY UNTIL TAKING 4 TABLETS AT BEDTIME    . metFORMIN (GLUCOPHAGE) 500 MG tablet Take 500 mg by mouth daily.    . propranolol (INDERAL) 10 MG tablet Take 10 mg by mouth 2 (two) times daily as needed. 6am and then 12/12:30pm  at school, third dose PRN    . risperiDONE (RISPERDAL) 3 MG tablet Take 3 mg by mouth 2 (two) times daily.    Marland Kitchen VALTOCO 20 MG DOSE 10 MG/0.1ML LQPK Place 1 spray into each nostril for seizure lasting more than 2 minutes 6 each 1   No current facility-administered medications on file prior to visit.   The medication list was reviewed and reconciled. All changes or newly prescribed medications were explained.  A complete medication list was provided to the patient/caregiver.  Physical Exam BP (!) 108/62   Pulse 80   Ht 5\' 10"  (1.778 m)   Wt (!) 262 lb (118.8 kg)   BMI 37.59 kg/m  Facility age limit for growth percentiles is 20 years.  No exam data present Gen: well appearing young man, obese.  Skin: No rash, No neurocutaneous stigmata. HEENT: Normocephalic, no dysmorphic features, no conjunctival injection, nares patent, mucous membranes moist, oropharynx clear. Neck: Supple, no meningismus. No focal tenderness. Resp: Clear to auscultation bilaterally CV: Regular rate, normal S1/S2, no murmurs, no rubs Abd: BS present, abdomen soft, non-tender, non-distended. No hepatosplenomegaly or mass Ext: Warm and well-perfused. No deformities, no muscle wasting, ROM full.  Neurological Examination: MS: Awake, alert, interactive. Poor eye contact, answers pointed questions with 1 word answers, speech was fluent.  Poor attention in room, mostly plays by herself. Cranial Nerves: Pupils were equal and reactive to light;  EOM normal, no nystagmus; no ptsosis, no double vision, intact facial sensation, face symmetric with full strength of facial muscles, hearing intact grossly.  Motor-Normal tone throughout, Normal strength in all muscle groups. No abnormal movements Reflexes- Reflexes 2+ and symmetric in the biceps, triceps, patellar and achilles tendon. Plantar responses flexor bilaterally, no clonus noted Sensation: Intact to light touch throughout.   Coordination: No dysmetria with reaching for  objects    Diagnosis:  Problem List Items Addressed This Visit      Other   Autistic disorder   Morbid obesity (HCC)    Other Visit Diagnoses    Seizures (HCC)    -  Primary   Relevant Medications   topiramate (TOPAMAX) 200 MG tablet   gabapentin (NEURONTIN) 600 MG tablet   Lacosamide 100 MG TABS      Assessment and Plan DANNEY BUNGERT is a 19 y.o. male with history of autism, problems with behaviors, morbid obesity and seizure disorder who presents for evaluation of increased seizures. I reviewed patient history and current medications with family. Parents informed me that patient has issues with anxiety and behavior even on medications. I informed them that Lamictal can be used to stabilize mood but this has not been the case with 24. As patient is on a low dose and it is not effective, I recommended that patient stop this medication. We reviewed patient's failed medications and I provided information about other medications we could try for seizures. I recommend that patient increase Gabapentin to 600mg  four times a day. I also recommend  patient start Vimpat as it works for all types of seizures and has few side effects. I informed family that a possible side effect is sedation. It is my recommendation that we start with the increased gabapentin first to observe any side effects before starting Vimpat. They agreed with this plan of action. As the frequency of patient's seizures seems to be about once a month, I recommend a follow-up in one month to see how patient is doing with seizures and to discuss any side effects.    -Continue Topamax, I have taken over the prescriptions -Increase gabapentin to 600mg  4 times daily, every day -Once on stable dose of gabapentin for 1-2 weeks, start Vimpat (lacosimide) 100mg  twice daily  -Please sign release of information for Dr .   Return in about 4 weeks (around 03/11/2020).  Jannifer Franklin MD MPH Neurology and  Neurodevelopment [facility] Child Neurology  [address] [place], [place], [place] Phone: [phone]  I spend 63 minutes on day of service on this patient including discussion with patient and family, coordination with other providers, and review of chart  By signing below, I, Dieudonne [id number] attest that this documentation has been prepared under the direction of [phone], MD.    I, Garth Schlatter, MD personally performed the services described in this documentation. All medical record entries made by the scribe were at my direction. I have reviewed the chart and agree that the record reflects my personal performance and is accurate and complete Electronically signed by Lorenz Coaster and Lorenz Coaster, MD 03/09/20 12:57 PM

## 2020-03-09 ENCOUNTER — Encounter (INDEPENDENT_AMBULATORY_CARE_PROVIDER_SITE_OTHER): Payer: Self-pay | Admitting: Pediatrics

## 2020-04-01 ENCOUNTER — Telehealth (INDEPENDENT_AMBULATORY_CARE_PROVIDER_SITE_OTHER): Payer: Medicaid Other | Admitting: Pediatrics

## 2020-04-30 ENCOUNTER — Telehealth (INDEPENDENT_AMBULATORY_CARE_PROVIDER_SITE_OTHER): Payer: Self-pay | Admitting: Family

## 2020-04-30 NOTE — Telephone Encounter (Signed)
°  Who's calling (name and relationship to patient) : Renae Fickle (dad)  Best contact number: [phone]  Provider they see: Dr. Artis Flock / Elveria Rising  Reason for call: Dad states that patient has been exhibiting severe aggressive behavior. He is concerned that this may be a side effect of Vimpat. Requests call back from Elveria Rising to discuss.    PRESCRIPTION REFILL ONLY  Name of prescription:  Pharmacy:
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
## 2020-04-30 NOTE — Telephone Encounter (Signed)
I called dad back and reviewed it, recommend decreasing Vimpat to 50mg  twice daily. We will reassess when I see him next month.    MD MPH

## 2020-05-01 NOTE — Telephone Encounter (Signed)
Dad contacted me today to say that he had learned that Alyn was upset because his mother is selling her home and will be moving from that home that he has known from childhood. Dad wants to keep the Vimpat dose as prescribed and not reduce it as discussed with Dr Artis Flock yesterday. I discussed with Dr Artis Flock and told Dad that would be ok to keep the dose as prescribed. TG

## 2020-05-05 NOTE — Telephone Encounter (Signed)
Dad contacted me to report that Brian Huerta had a 4 minute convulsive seizure at school yesterday. He said that his face turned blue, he became rigid and he fell from his chair, but the teacher was able to catch him and ease him to the floor. He was given Valtoco and EMS was called to the school. He was assessed and vital signs normal, so went home with Dad rather than ER. The left arm was moving less than the right after the seizure so Dad thinks it might have been sore from being rigid. Dad is keeping him home from school today to monitor him. TG

## 2020-05-13 ENCOUNTER — Telehealth (INDEPENDENT_AMBULATORY_CARE_PROVIDER_SITE_OTHER): Payer: Self-pay | Admitting: Family

## 2020-05-13 NOTE — Telephone Encounter (Signed)
Dad contacted me to report that Brian Huerta had a 1 min 10 sec seizure at school at 2:10pm this afternoon. He was not injured in the course of the seizure. He did not receive Valtoco and recovered quickly. He is receiving Vimpat 100mg  twice per day. Dad can be reached at [phone]. TG

## 2020-05-14 MED ORDER — LACOSAMIDE 100 MG PO TABS: 150.0000 mg | ORAL_TABLET | Freq: Two times a day (BID) | ORAL | 3 refills | Status: DC

## 2020-05-14 NOTE — Telephone Encounter (Signed)
Patient's scheduled appointment is already set to be virtual. Barrington Ellison

## 2020-05-14 NOTE — Telephone Encounter (Signed)
I returned call and father confirms what Inetta Fermo reported, this event much better than previous ones.  I discussed that next step can be increasing Vimpat, however there have been concerns about his behavior on Vimpat. Is still anxious, but no aggression recently.  Father feels his increased seizures is related to black mold, discussed other concerns of father including blood sugar and getting overheated. Ultimately decided to increase dose to 150mg  BID. New prescription sent. Will follow-up 11/10, father requesting this be virtual.   13/10, please change upcoming appointment to virtual.   Irving Burton MD MPH

## 2020-05-22 NOTE — Telephone Encounter (Signed)
Dad called to confirm that next Wednesday's appointment is going to be virtual. I confirmed that appointment is virtual and they we have the correct number to send link.

## 2020-05-27 ENCOUNTER — Encounter (INDEPENDENT_AMBULATORY_CARE_PROVIDER_SITE_OTHER): Payer: Self-pay | Admitting: Pediatrics

## 2020-05-27 ENCOUNTER — Telehealth (INDEPENDENT_AMBULATORY_CARE_PROVIDER_SITE_OTHER): Payer: Medicaid Other | Admitting: Pediatrics

## 2020-05-27 ENCOUNTER — Other Ambulatory Visit: Payer: Self-pay

## 2020-05-27 VITALS — Ht 70.75 in | Wt 268.0 lb

## 2020-05-27 DIAGNOSIS — R569 Unspecified convulsions: Secondary | ICD-10-CM | POA: Diagnosis not present

## 2020-05-27 DIAGNOSIS — F840 Autistic disorder: Secondary | ICD-10-CM

## 2020-05-27 MED ORDER — TOPIRAMATE 200 MG PO TABS: 200.0000 mg | ORAL_TABLET | Freq: Two times a day (BID) | ORAL | 3 refills | Status: DC

## 2020-05-27 MED ORDER — LACOSAMIDE 100 MG PO TABS: 150.0000 mg | ORAL_TABLET | Freq: Two times a day (BID) | ORAL | 3 refills | Status: DC

## 2020-05-27 MED ORDER — PROPRANOLOL HCL ER 60 MG PO CP24: 60.0000 mg | ORAL_CAPSULE | Freq: Every day | ORAL | 3 refills | Status: DC

## 2020-05-27 NOTE — Progress Notes (Signed)
Patient: Brian Huerta MRN: [id number] Sex: male DOB: 2000/12/14  Provider: Lorenz Coaster, MD Location of Care: [facility]  Note type: Routine follow-up  This is a Pediatric Specialist E-Visit follow up consult provided via Mychart video Brian Huerta and their parent/guardian consented to an E-Visit consult today.  Location of patient: Brian Huerta is at home Location of provider: Shaune Pascal is at home Patient was referred by Norm Salt, PA   The following participants were involved in this E-Visit: Lorre Munroe, CMA      Lorenz Coaster, MD  History of Present Illness:  Brian Huerta is a [place] male with history of autism, problems with behaviors, morbid obesity and seizure disorder who I am seeing for routine follow-up. Patient was last seen on 02/12/20 where Topamax was continued, Gabapentin was increased to 600mg  four times daily, and plan was made to start Vimpat 100mg  twice daily once on stable dose of Gabapentin.  Since the last appointment, father contacted office and was initially concerned for behavior caused by VImpat, but then on 04/30/20 reported that patient had a 1 min 10 sec breakthrough seizure. It was recommended that Vimpat be increased to 150mg  BID.   Patient presents today with father and mother.  He reports no further seizures since the increase.  His behavior did not change any further with the increase in Vimpat and he feels the behavior was unrelated. He is doing well on medications with no side effects.  He is seeing Dr A next month for medication management related to mood, however father interested in my opinion about medications.  Reviewed all medications with father.    Patient History:  Sz history-2014- Petit mal seizures. Started on Topamax, then gabapentin. 2019 grand mal seizure at lunch, shaking all over lasting 30 seconds.  Seen by Dr , kept him on his current medications.  April 2021.   Had mederna shot, parents worried that this exacerbated seizures.  Had 2 minute seizure.  Seen In April 27, started nothing.  Seen again in May, started lamictal.  Went up to 75mg  but felt it increased anxiety, started panic attacks, and tachycardia.   Sz failed meds: Lamictal  Behavior failed meds: zyprexa (paradoxical effect), ativan (paradoxical effect), valium (paradoxical effect).  Tried SSRIs, unsure what, "paradoxical response".     Past Medical History Past Medical History:  Diagnosis Date  . Anxiety    Phreesia 02/02/2020  . Autism   . Depression    Phreesia 02/02/2020  . Prediabetes   . Seizures [facility])     Surgical History Past Surgical History:  Procedure Laterality Date  . TURBINATE REDUCTION      Family History family history includes Anxiety disorder in his mother; Bipolar disorder in his brother; Depression in his mother; Hypothyroidism in his mother; Migraines in his maternal grandmother; Sleep apnea in his father.   Social History Social History   Social History Narrative   Brian Huerta, is currently in school at 02/04/2020. He is currently on the completion track. He lives with Dad during the school year. He has 1 older brother and a sibling that was given up for adoption.    Allergies No Known Allergies  Medications Current Outpatient Medications on File Prior to Visit  Medication Sig Dispense Refill  . clomiPRAMINE (ANAFRANIL) 75 MG capsule TAKE 2 CAPSULES BY MOUTH ONCE DAILY IN THE MORNING AND 1 AT BEDTIME FOR SEVERE OCD    . cloNIDine (CATAPRES) 0.2 MG tablet Take  0.2 mg by mouth 2 (two) times daily. Bedtime only (8:15p) if not resting within an hour then he gets second dose.    . gabapentin (NEURONTIN) 600 MG tablet Take 1 tablet (600 mg total) by mouth in the morning, at noon, in the evening, and at bedtime. 120 tablet 3  . metFORMIN (GLUCOPHAGE) 500 MG tablet Take 500 mg by mouth daily.    . risperiDONE (RISPERDAL) 3 MG tablet Take 3 mg by mouth 2  (two) times daily.     No current facility-administered medications on file prior to visit.   The medication list was reviewed and reconciled. All changes or newly prescribed medications were explained.  A complete medication list was provided to the patient/caregiver.  Physical Exam Ht 5' 10.75" (1.797 m) Comment: reported  Wt 268 lb (121.6 kg) Comment: reported  BMI 37.64 kg/m  Facility age limit for growth percentiles is 20 years.  No exam data present  Examlimited by virtual visit General: NAD, well nourished, responds when engaged.  HEENT: normocephalic, no eye or nose discharge.  MMM  Cardiovascular: warm and well perfused Lungs: Normal work of breathing, no rhonchi or stridor Skin: No birthmarks, no skin breakdown Abdomen: soft, non tender, non distended Extremities: No contractures or edema. Neuro: EOM intact, face symmetric. No direct eye contact. Moves all extremities equally and at least antigravity. No abnormal movements. Normal gait.    Diagnosis: 1. Seizures (HCC)   2. Autistic disorder   3. Morbid obesity (HCC)     Assessment and Plan Brian Huerta is a [place] male with history of autism and epilepsy who I am seeing in follow-up. Seizures are so far controlled on Topamax and Vimpat.  Gabapentin in my view is more related to mood, with possible help for seizures.  Risperdal high and father reports he sleeps well on clonidine 0.2 x2 (which is also maximized).  I recommended therefore increasing Propranolol. Father requests I prescribe this, as Dr Jannifer Franklin is less comfortable. Given dosage, recommend switching to 60mg  XR for once daily dosing.  This should help with anxiety, father also worried about history of high blood pressure. Advised to monitor for signs of low blood pressure, and measure blood pressure when able, such as at the grocery store. Father to call for any breakthrough seizures, at that time can increase Vimpat further, I agree with father that it is  unlikely this caused the behavior effects and more likely situational.     Meds ordered this encounter  Medications  . propranolol ER (INDERAL LA) 60 MG 24 hr capsule    Sig: Take 1 capsule (60 mg total) by mouth at bedtime.    Dispense:  31 capsule    Refill:  3  . Lacosamide 100 MG TABS    Sig: Take 1.5 tablets (150 mg total) by mouth in the morning and at bedtime.    Dispense:  90 tablet    Refill:  3  . topiramate (TOPAMAX) 200 MG tablet    Sig: Take 1 tablet (200 mg total) by mouth 2 (two) times daily.    Dispense:  120 tablet    Refill:  3    Return in about 3 months (around 08/27/2020).    I spend 32 minutes on day of service on this patient including review of chart, discussion with patient and family, coordination of orders.   10/25/2020 MD MPH Neurology and Neurodevelopment [facility] Child Neurology  [address] [place], [place], [place] Phone: [phone]

## 2020-06-04 ENCOUNTER — Telehealth (INDEPENDENT_AMBULATORY_CARE_PROVIDER_SITE_OTHER): Payer: Self-pay | Admitting: Family

## 2020-06-04 NOTE — Telephone Encounter (Signed)
If he's acting fine, I think it's ok to continue the regimen and continue to monitor.  If his blood pressure is still low, consider giving propranolol in the morning so it does not overlap as much with clonidine.   Lorenz Coaster MD MPH

## 2020-06-04 NOTE — Telephone Encounter (Signed)
Dad Avie Echevaria [name] contacted me to report that Brian Huerta's BP this morning was 104/69 this morning, heart rate was 85 and sats were 99%. He said that he received the Propranolol ER last night around 8:15PM. He did not receive Clonidine and it took him until about 9:50 to go to sleep. He said that Harshaan was active and seemed fine this morning despite the lower BP. TG

## 2020-06-05 NOTE — Telephone Encounter (Signed)
I relayed the information to Dad. TG

## 2020-06-15 ENCOUNTER — Other Ambulatory Visit (INDEPENDENT_AMBULATORY_CARE_PROVIDER_SITE_OTHER): Payer: Self-pay | Admitting: Family

## 2020-06-15 DIAGNOSIS — R569 Unspecified convulsions: Secondary | ICD-10-CM

## 2020-06-15 MED ORDER — VALTOCO 20 MG DOSE 10 MG/0.1ML NA LQPK: NASAL | 3 refills | Status: DC

## 2020-06-15 NOTE — Telephone Encounter (Signed)
Spoke with father about the patient's seizure. He reports that after the two minute mark, the school administered the Valtoco. He states that the patient's blood pressure was elevated this time. He states that his bp is normal any other time. He states that the patient has a head cold and was congested. He states that the patient was standing or walking near the gym. He states that this time there were no all over body convulsing but his head was shaking, facial convulsions and his lips.He also states that his hands were shaking. He states that he came to pretty quickly. He states that when he got to the school, the patient's gait was a little off. He states that he did not tmiss his medication dose this morning. The school states that overall this seizure was not as bad as the others. He states that he asleep at this time.

## 2020-06-15 NOTE — Telephone Encounter (Signed)
Thanks

## 2020-06-15 NOTE — Telephone Encounter (Signed)
Who's calling (name and relationship to patient) : Allena Earing  Best contact number: [phone]  Provider they see: Elveria Rising  Reason for call: Patient had a four and a half minute seizure. Please call back to discuss Patient also needs a refill for valtoco  Call ID:      PRESCRIPTION REFILL ONLY  Name of prescription: valtoco  Pharmacy:  [facility] [address]

## 2020-06-30 ENCOUNTER — Telehealth (INDEPENDENT_AMBULATORY_CARE_PROVIDER_SITE_OTHER): Payer: Self-pay | Admitting: Family

## 2020-06-30 NOTE — Telephone Encounter (Signed)
Dad emailed the following information to me:  This is really for informational purposes only and documentation of what I am going to do with his Propranolol.  I had a virtual appointment with Dr A on 12/9.  He is totally in agreement with Dr Blair Heys suggestion of changing the dosage time of the Propranolol 60mg  ER pill from night time to 6am morning med as he has acclimated and Dr A agrees with Dr that the Propranolol should help with behavior and take full effect about 2 hours after he takes it which would be perfect, 8am at school it should start hitting him.  Also, I reviewed that Taryn's BP has been running a bit high, granted, he had a sinus infection recently and that may be the cause but I am going to continue to monitor it and Dr A agrees with me that since we have used Clonidine for so long as a night time med that perhaps his body has gotten used to it and since we quit giving him Clonidine once we started the Propranolol ER pill then maybe that's a cause of the elevated BP.  The bottom number or diastolic is what has me concerned as it is always running above 80 now and it used to run as low as 69 before.  I will review this with his PCP as well but Dr A has kept the Clonidine script active.  He is done with school for the holiday break as of this Friday, 12/17.  So, I will start giving him Propranolol 60mg  ER on Saturday, 12/18 with morning meds and will probably start giving Clonidine again to help him go to sleep the night of 12/17 since I won't be giving him the Propranolol that night.  We will monitor him and get him on this schedule before he goes back to school on 07/21/20.

## 2020-06-30 NOTE — Telephone Encounter (Signed)
Sounds good, thanks.   Judeth Cornfield

## 2020-07-28 ENCOUNTER — Encounter (INDEPENDENT_AMBULATORY_CARE_PROVIDER_SITE_OTHER): Payer: Self-pay | Admitting: Pediatrics

## 2020-08-12 ENCOUNTER — Telehealth (INDEPENDENT_AMBULATORY_CARE_PROVIDER_SITE_OTHER): Payer: Self-pay | Admitting: Family

## 2020-08-12 MED ORDER — PROPRANOLOL HCL ER 60 MG PO CP24
60.0000 mg | ORAL_CAPSULE | Freq: Every morning | ORAL | 3 refills | Status: DC
Start: 2020-08-12 — End: 2020-08-17

## 2020-08-12 NOTE — Telephone Encounter (Signed)
Thank you :)

## 2020-08-12 NOTE — Telephone Encounter (Signed)
Dad emailed the following information to me and asked that I share with Dr Artis Flock:  We have a virtual appointment with Dr Artis Flock on Monday, 08/17/20 at 145pm.  We have been giving him the propranolol ER in the mornings and clonidine at night to help him settle to sleep.  So, we will need the propranolol bottle to now say to give it to him in the am, lol, I think that was prescribed by Dr Artis Flock.  I have been kinda out of it mentally lately and just getting my bearings.  My wife and I got Covid, symptoms started 12/31 for me and 07/21/20 for her.  I have had 2 shots and she has had 2 shots and booster. I tested positive on 07/21/20 and she tested positive 07/25/20.  Akshay went to his mom's house on 12/25 halfway through Christmas and didn't return to our home until 08/04/20 so he was not around [place] and I don't believe he got it but he was diagnosed with a sinus infection and upper respiratory infection yesterday via virtual visit with his PCP, Norva Riffle, P.A. with [facility].  I am giving him a home test today just to make sure before he goes back to school tomorrow.  He is on a Z pack and we are to pick up a steroid to help with his lungs to clear out.  His mom smokes in her house and he sounded awful on 08/04/20 when he came to our house.  His PCP virtual appt got cancelled twice over last 2 weeks so we just one yesterday.  He has been out of school since 08/04/20.  He had 2 seizures at his mom's house during the 25 days he was with her.  I don't know much for details other than the first one was, I believe, less than 2 minutes and she didn't give Valtoco.  The 2nd one, I was sick with Covid when she called, she had given him Valtoco at little over 2 minutes and he wasn't coming out of it too quickly per what I heard on the phone and it was over 4 minutes so I had her call EMS.  They came and checked him out, didn't recommend hospital.  The only thing I have seen over here is, I think, a type of  seizure where he looks up with his eyes and moves them rapidly back and forth from side to side and you can't get him to stop or focus on you for like 20 seconds.  I am not sure if that is a seizure or him ignoring but it's odd.  I don't think we need to make a med change at this time.  Dad sent a follow up email to report that Toby's home covid test was negative.   TG

## 2020-08-17 ENCOUNTER — Telehealth (INDEPENDENT_AMBULATORY_CARE_PROVIDER_SITE_OTHER): Payer: Medicaid Other | Admitting: Pediatrics

## 2020-08-17 ENCOUNTER — Encounter (INDEPENDENT_AMBULATORY_CARE_PROVIDER_SITE_OTHER): Payer: Self-pay | Admitting: Pediatrics

## 2020-08-17 VITALS — Ht 70.5 in | Wt 272.0 lb

## 2020-08-17 DIAGNOSIS — F840 Autistic disorder: Secondary | ICD-10-CM | POA: Diagnosis not present

## 2020-08-17 DIAGNOSIS — F6381 Intermittent explosive disorder: Secondary | ICD-10-CM

## 2020-08-17 DIAGNOSIS — R569 Unspecified convulsions: Secondary | ICD-10-CM | POA: Diagnosis not present

## 2020-08-17 MED ORDER — GABAPENTIN 600 MG PO TABS: 600.0000 mg | ORAL_TABLET | Freq: Four times a day (QID) | ORAL | 3 refills | Status: DC

## 2020-08-17 MED ORDER — LACOSAMIDE 100 MG PO TABS: 200.0000 mg | ORAL_TABLET | Freq: Two times a day (BID) | ORAL | 3 refills | Status: DC

## 2020-08-17 MED ORDER — PROPRANOLOL HCL ER 60 MG PO CP24
60.0000 mg | ORAL_CAPSULE | Freq: Every morning | ORAL | 3 refills | Status: DC
Start: 2020-08-17 — End: 2020-11-09

## 2020-08-17 MED ORDER — TOPIRAMATE 200 MG PO TABS: 200.0000 mg | ORAL_TABLET | Freq: Two times a day (BID) | ORAL | 3 refills | Status: DC

## 2020-08-17 NOTE — Patient Instructions (Signed)
Increase Vimpat to 200mg  twice daily  Good work with the behavioral interventions they are trying. There have been several changes in the last month, so it may just take time but I like that you are giving rewards and consequences for behavior, and that the school is trying different behavioral interventions.    Recommend keeping appointment with Dr to decide if any further changes need to be made for behavior  General First Aid for All Seizure Types The first line of response when a person has a seizure is to provide general care and comfort and keep the person safe. The information here relates to all types of seizures. What to do in specific situations or for different seizure types is listed in the following pages. Remember that for the majority of seizures, basic seizure first aid is all that may be needed. Always Stay With the Person Until the Seizure Is Over  Seizures can be unpredictable and it's hard to tell how long they may last or what will occur during them. Some may start with minor symptoms, but lead to a loss of consciousness or fall. Other seizures may be brief and end in seconds.  Injury can occur during or after a seizure, requiring help from other people. Pay Attention to the Length of the Seizure Look at your watch and time the seizure - from beginning to the end of the active seizure.  Time how long it takes for the person to recover and return to their usual activity.  If the active seizure lasts longer than the person's typical events, call for help.  Know when to give 'as needed' or rescue treatments, if prescribed, and when to call for emergency help. Stay Calm, Most Seizures Only Last a Few Minutes A person's response to seizures can affect how other people act. If the first person remains calm, it will help others stay calm too.  Talk calmly and reassuringly to the person during and after the seizure - it will help as they recover from the seizure. Prevent  Injury by Moving Nearby Objects Out of the Way  Remove sharp objects.  If you can't move surrounding objects or a person is wandering or confused, help steer them clear of dangerous situations, for example away from traffic, train or subway platforms, heights, or sharp objects. Make the Person as Comfortable as Possible Help them sit down in a safe place.  If they are at risk of falling, call for help and lay them down on the floor.  Support the person's head to prevent it from hitting the floor. Keep Onlookers Away Once the situation is under control, encourage people to step back and give the person some room. Waking up to a crowd can be embarrassing and confusing for a person after a seizure.  Ask someone to stay nearby in case further help is needed. Do Not Forcibly Hold the Person Down Trying to stop movements or forcibly holding a person down doesn't stop a seizure. Restraining a person can lead to injuries and make the person more confused, agitated or aggressive. People don't fight on purpose during a seizure. Yet if they are restrained when they are confused, they may respond aggressively.  If a person tries to walk around, let them walk in a safe, enclosed area if possible. Do Not Put Anything in the Person's Mouth! Jaw and face muscles may tighten during a seizure, causing the person to bite down. If this happens when something is in the mouth,  the person may break and swallow the object or break their teeth!  Don't worry - a person can't swallow their tongue during a seizure. Make Sure Their Breathing is Molli Knock If the person is lying down, turn them on their side, with their mouth pointing to the ground. This prevents saliva from blocking their airway and helps the person breathe more easily.  During a convulsive or tonic-clonic seizure, it may look like the person has stopped breathing. This happens when the chest muscles tighten during the tonic phase of a seizure. As this part of a  seizure ends, the muscles will relax and breathing will resume normally.  Rescue breathing or CPR is generally not needed during these seizure-induced changes in a person's breathing. Do not Give Water, Pills or Food by Mouth Unless the Person is Fully Alert If a person is not fully awake or aware of what is going on, they might not swallow correctly. Food, liquid or pills could go into the lungs instead of the stomach if they try to drink or eat at this time.  If a person appears to be choking, turn them on their side and call for help. If they are not able to cough and clear their air passages on their own or are having breathing difficulties, call 911 immediately. Call for Emergency Medical Help A seizure lasts 5 minutes or longer.  One seizure occurs right after another without the person regaining consciousness or coming to between seizures.  Seizures occur closer together than usual for that person.  Breathing becomes difficult or the person appears to be choking.  The seizure occurs in water.  Injury may have occurred.  The person asks for medical help. Be Sensitive and Supportive, and Ask Others to Do the Same Seizures can be frightening for the person having one, as well as for others. People may feel embarrassed or confused about what happened. Keep this in mind as the person wakes up.  Reassure the person that they are safe.  Once they are alert and able to communicate, tell them what happened in very simple terms.  Offer to stay with the person until they are ready to go back to normal activity or call someone to stay with them. Authored by: Lura Em, MD  Joen Laura Pamalee Leyden, RN, [place]  Maralyn Sago, MD on 01/2012  Reviewed by: Maralyn Sago  MD  Joen Laura Shafer  RN  [place] on 09/2012

## 2020-08-17 NOTE — Progress Notes (Signed)
Patient: Brian Huerta MRN: [id number] Sex: male DOB: February 19, 2001  Provider: Lorenz Coaster, MD Location of Care: [facility]  Note type: Routine follow-up  This is [facility] E-Visit follow up consult provided via Mychart video Brian Huerta and their parent/guardian  consented to an E-Visit consult today.  Location of patient: Brian Huerta is at home Location of provider: Shaune Huerta is at home Patient was referred by Brian Salt, PA   The following participants were involved in this E-Visit: Brian Huerta, Brian Huerta      Brian Coaster, MD  History of Present Illness:  Brian Huerta is Huerta 20 y.o. male with history of autism, problems with behaviors, morbid obesity and seizure disorder  who I am seeing for routine follow-up. Patient was last seen on 05/27/20 where Propanolol was increased to 60mg  XR  Since the last appointment, father contacted out office on 06/04/20 and reported that patient's BP was low. It was recommended that they consider giving Propranolol in the morning to avoid overlap with Clonidine and continue to monitor.   Patient presents today with both parents. They report:      Seizures 12/29 and 1/4. Had to give Valtoco for the second event, seizure continued and mom called EMS.  However when they came out he had stopped and he did not have to go into the hospital. However wasn't getting complete dosing of Vimpat.  Since he has been on full dose, he hasn't had further events. He was also sick around that time, was prescribed antibiotic and steroid. Dad noticed events of eyes going back and forth for about 20 seconds. These have continued, still Huerta couple times per week.     Behavior: More "smacking", looking for attention (positive or negative). Increased Propranolol and switched it to morning, Clonidine at night.  Dad not sure if there was improvement.  Reports that meds "run out" by about lunchtime. Had  appointment with Brian 1/30 on 12/9. Sounds like he wasn't having trouble then.  Behavior started the end of December. At school, they have an additional behavior support person to let him walk, which helps. He is waking up more, only giving one dose of clonidine, not the second. He is scheduled to see Brian Huerta at 08/25/20.     Patient History:  Sz history-2014- Petit mal seizures (just staring). Started on Topamax, then gabapentin. 2019 grand mal seizure at lunch, shaking all over lasting 30 seconds. Seen by Brian Huerta, kept him on his current medications. April 2021. Had Moderna vaccine, parents worried that this exacerbated seizures. Had 2 minute seizure. Seen In April 27, started nothing. Seen again in May, started lamictal. Went up to 75mg  but felt it increased anxiety, started panic attacks,and tachycardia.  Looking back and forth, unresponsive, loss of bladder. Common when going back to school after Huerta break.   Sz failed meds: Lamictal  Behavior failed meds: zyprexa (paradoxical effect), ativan (paradoxical effect), valium (paradoxical effect). Tried SSRIs, unsure what, "paradoxical response".    During visit we discussed patient's recent seizures. I recommend increasing Vimpat  to address events of the eyes that are likely seizure. Patient is also at an appropriate weight for increase. Also discussed behavior and sleep. Urged family to continue behavioral interventions to address aggressive behavior. I suggested discussing behavior with Brian. June for further medication manage options for behavior.   Past Medical History Past Medical History:  Diagnosis Date  . Anxiety    Phreesia 02/02/2020  .  Autism   . Depression    Phreesia 02/02/2020  . Prediabetes   . Seizures Brian Huerta)     Surgical History Past Surgical History:  Procedure Laterality Date  . Brian Huerta      Family History family history includes Anxiety disorder in his mother; Bipolar disorder in his  brother; Depression in his mother; Hypothyroidism in his mother; Migraines in his maternal grandmother; Sleep apnea in his father.   Social History Social History   Social History Narrative   Brian Huerta, is currently in school at [employer]. He is currently on the completion track. He lives with Dad during the school year. He has 1 older brother and Huerta sibling that was given up for adoption.    Allergies No Known Allergies  Medications Current Outpatient Medications on File Prior to Visit  Medication Sig Dispense Refill  . clomiPRAMINE (ANAFRANIL) 75 MG capsule TAKE 2 CAPSULES BY MOUTH ONCE DAILY IN THE MORNING AND 1 AT BEDTIME FOR SEVERE OCD    . cloNIDine (CATAPRES) 0.2 MG tablet Take 0.2 mg by mouth 2 (two) times daily. Bedtime only (8:15p) if not resting within an hour then he gets second dose.    . metFORMIN (GLUCOPHAGE) 500 MG tablet Take 500 mg by mouth daily.    . risperiDONE (RISPERDAL) 3 MG tablet Take 3 mg by mouth 2 (two) times daily.    Marland Kitchen VALTOCO 20 MG DOSE 10 MG/0.1ML LQPK Place 1 spray into each nostril for seizure lasting more than 2 minutes 6 each 3   No current facility-administered medications on file prior to visit.   The medication list was reviewed and reconciled. All changes or newly prescribed medications were explained.  Huerta complete medication list was provided to the patient/caregiver.  Physical Exam Ht 5' 10.5" (1.791 m) Comment: reported  Wt 272 lb (123.4 kg) Comment: reported  BMI 38.48 kg/m  Facility age limit for growth percentiles is 20 years.  No exam data present  Vitals and exam limited due to virtual visit General: NAD, well nourished  Large young man.   HEENT: normocephalic, no eye or nose discharge.  MMM  Cardiovascular: warm and well perfused Lungs: Normal work of breathing, no rhonchi or stridor Skin: No birthmarks, no skin breakdown Abdomen: soft, non tender, non distended Extremities: No contractures or edema. Neuro: Awake, alert.  No  spontaneous speech, but repeats what parents say. EOM intact, face symmetric. Moves all extremities equally and at least antigravity. No abnormal movements. Normal gait.      Diagnosis: 1. Seizures (HCC)   2. Autistic disorder   3. Intermittent explosive disorder     Assessment and Plan Brian Huerta is Huerta 20 y.o. male with history of history of autism, problems with behaviors, morbid obesity and seizure disorder who I am seeing in follow-up. During visit we discussed patient's recent seizures. I recommend increasing Vimpat  to address events of the eyes that are likely seizure. I think the eyes going back and forth are likely focal seizures. Dad reports that he is somewhat interactive but not able to engage. He is at Huerta relatively low dosage of Vimpat for his weight, so I would like to increase. If this does not work we may need to discuss repeating an EEG. However, this will be difficult with his behavior. I am hopeful the Vimpat may be helpful for his behavior but it very possible that it might not be.  I defer this to Brian. Jannifer Franklin who will be seeing patient next  week. If his blood pressure is okay it would be acceptable to increase his Clonidine again if he is only taking one tablet. This is being prescribed by Brian. Jannifer Franklin so I will again defer to him, but I am in support if he chooses to change it. I urged family to continue behavioral interventions to address aggressive behavior.    Increase Vimpat to 200mg  twice daily  Good work with the behavioral interventions they are trying. There have been several changes in the last month, so it may just take time but I like that you are giving rewards and consequences for behavior, and that the school is trying different behavioral interventions.    Recommend keeping appointment with Brian to decide if any further changes need to be made for behavior   Return in about 3 months (around 11/14/2020).    I spend 32 minutes on day of service  on this patient including discussion with patient and family, coordination with other providers, and review of chart   11/16/2020 MD MPH Neurology and Neurodevelopment [facility]  [address] [place], [place], [place] Phone: [phone]    By signing below, I, Dieudonne [phone] attest that this documentation has been prepared under the direction of Garth Schlatter, MD.    I, Brian Coaster, MD personally performed the services described in this documentation. All medical record entries made by the scribe were at my direction. I have reviewed the chart and agree that the record reflects my personal performance and is accurate and complete Electronically signed by Brian Huerta and Denyce Robert, MD 08/24/20 7:02 AM

## 2020-08-24 ENCOUNTER — Encounter (INDEPENDENT_AMBULATORY_CARE_PROVIDER_SITE_OTHER): Payer: Self-pay | Admitting: Pediatrics

## 2020-09-04 ENCOUNTER — Telehealth (INDEPENDENT_AMBULATORY_CARE_PROVIDER_SITE_OTHER): Payer: Self-pay | Admitting: Family

## 2020-09-04 NOTE — Telephone Encounter (Signed)
Dad Avie Echevaria [name] contacted me to report that he is taking Hydroxyzine 25mg  at 10AM along with the Gabapentin, and is still hitting and yelling. He says that Murriel gets angry very easily. Dad wants call back from Dr Olena Leatherwood. TG

## 2020-09-07 ENCOUNTER — Other Ambulatory Visit (INDEPENDENT_AMBULATORY_CARE_PROVIDER_SITE_OTHER): Payer: Self-pay | Admitting: Pediatrics

## 2020-09-07 MED ORDER — GABAPENTIN 600 MG PO TABS: ORAL_TABLET | ORAL | 3 refills | Status: DC

## 2020-09-07 NOTE — Telephone Encounter (Signed)
I returned father's call. He advised that Dr Jannifer Franklin started hydroxyzine, that wasn't a question for me but just an FYI.  However, he does need the gabapentin prescription to be specific to say he takes it at 6am, 10am, 12:30pm, and 5pm. New prescription sent.  He also needs a new medication administration form.  He is going to fill it out, sign it and send it via email to Tina's office.  I let him know it will likely be next week but we can fax it directly to his school.  Dad said this was fine, he currently has Dr Roberts Gaudy old bottle and old medication form.    Lorenz Coaster MD MPH

## 2020-09-14 ENCOUNTER — Telehealth (INDEPENDENT_AMBULATORY_CARE_PROVIDER_SITE_OTHER): Payer: Self-pay | Admitting: Family
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
## 2020-09-14 NOTE — Telephone Encounter (Signed)
I think we need to discuss the case with Dr Jannifer Franklin (or his team) directly. Dad thought it was Vimpat before and then felt it was resolved without a change in dose.  I haven't changed the Vimpat dose recently, and with the known symptom profile, I doubt it is the cause.   Lorenz Coaster MD MPH

## 2020-09-21 ENCOUNTER — Telehealth (INDEPENDENT_AMBULATORY_CARE_PROVIDER_SITE_OTHER): Payer: Self-pay | Admitting: Family

## 2020-09-21 NOTE — Telephone Encounter (Signed)
With his last seizures, they found that he was actually getting incomplete dosing of Vimpat.  Please ensure he is getting all his does. If he is, I think these are the first seizures he's had with good compliance. I would recommend not changing anything for now.    Lorenz Coaster MD MPH
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
## 2020-09-24 NOTE — Telephone Encounter (Signed)
I relayed the recommendation to Dad. TG

## 2020-10-07 ENCOUNTER — Telehealth (INDEPENDENT_AMBULATORY_CARE_PROVIDER_SITE_OTHER): Payer: Self-pay | Admitting: Family

## 2020-10-07 NOTE — Telephone Encounter (Signed)
Dad Brian Huerta contacted me to report that Brian Huerta's mother was found deceased at her home yesterday, likely from a medical cause. Dad doesn't think Brian Huerta is aware. He didn't see his mother but did see the EMS at the home. Dad is taking Brian Huerta and his other son for counseling today. TG

## 2020-10-08 NOTE — Telephone Encounter (Signed)
Thank you for the information. Please let dad know he can reach out to [place] for resources if he needs them.   Lorenz Coaster MD MPH

## 2020-11-04 IMAGING — DX DG CHEST 1V PORT
1 series · 1 of 1 positions shown · non-contrast
Comparison: None.

CLINICAL DATA: Tachycardia

EXAM:
PORTABLE CHEST 1 VIEW

[chest ap]
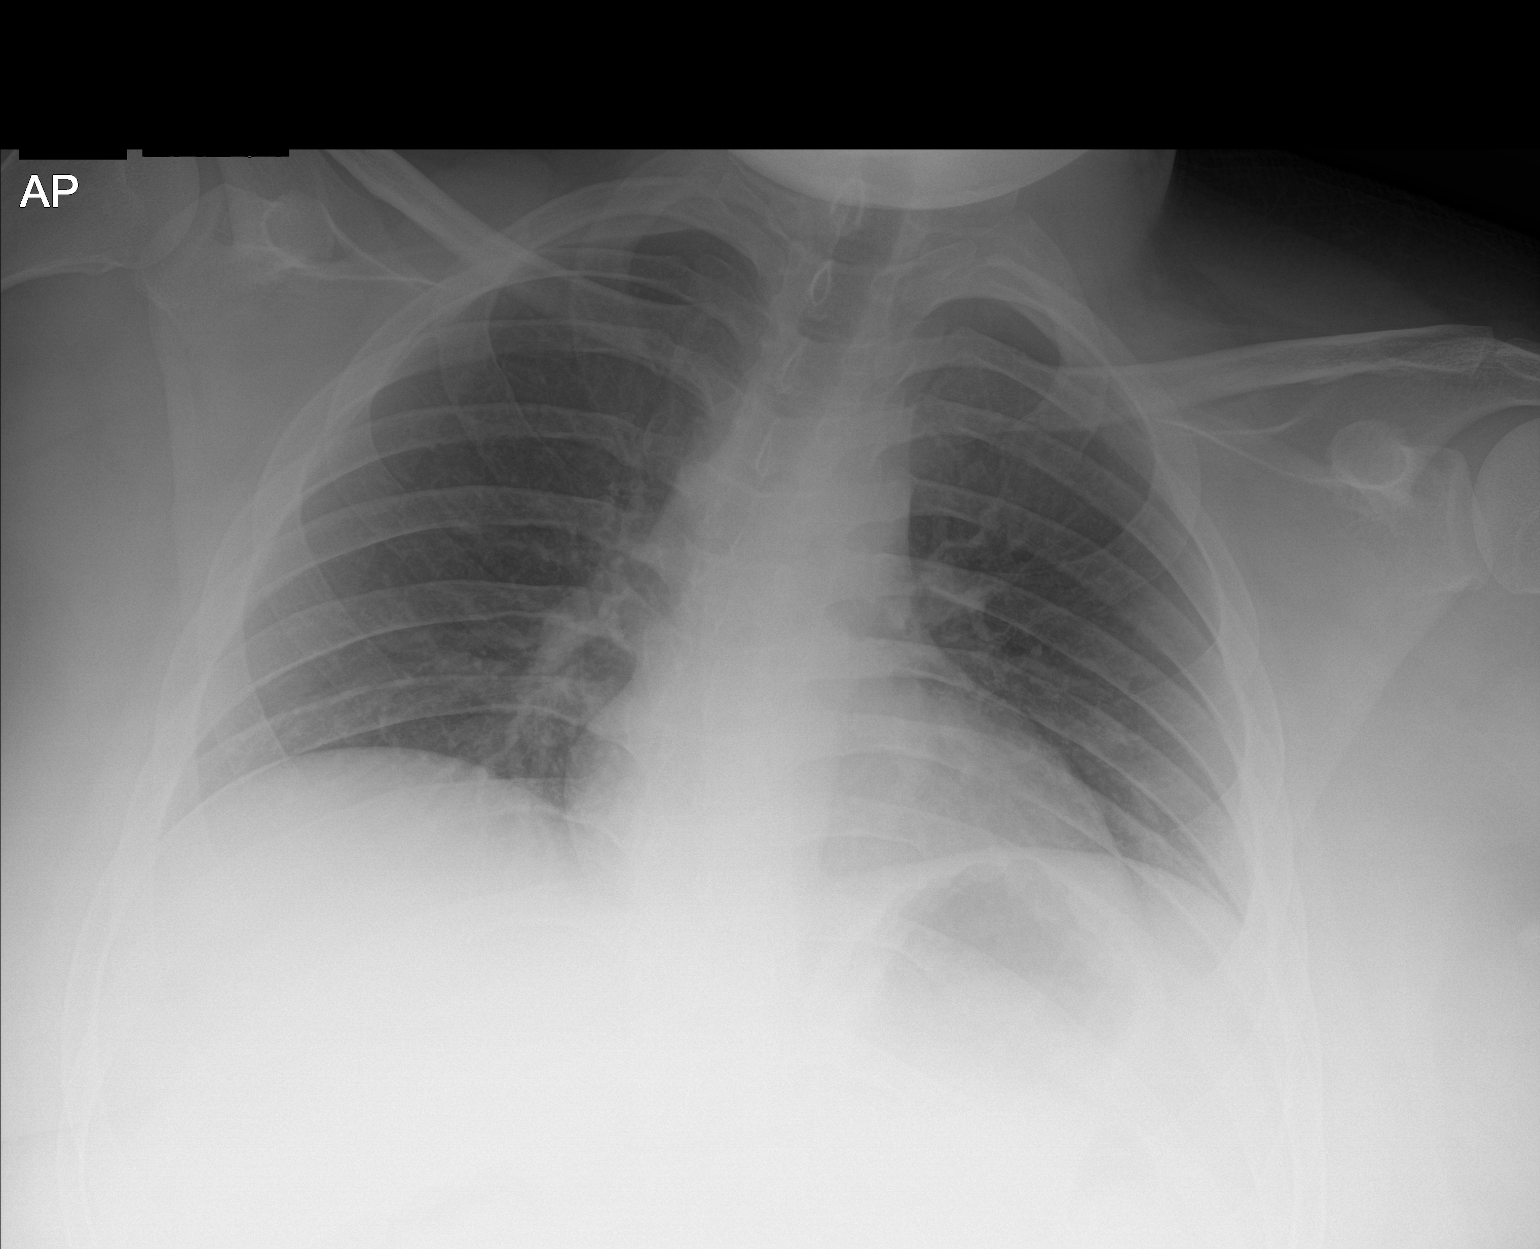

[1 of 1 positions shown; findings below may reference images not displayed]

FINDINGS: The heart size and mediastinal contours are within normal limits.
Both lungs are clear. The visualized skeletal structures are
unremarkable.
IMPRESSION: Normal study.

## 2020-11-09 ENCOUNTER — Telehealth (INDEPENDENT_AMBULATORY_CARE_PROVIDER_SITE_OTHER): Payer: Medicaid Other | Admitting: Pediatrics

## 2020-11-09 ENCOUNTER — Encounter (INDEPENDENT_AMBULATORY_CARE_PROVIDER_SITE_OTHER): Payer: Self-pay | Admitting: Pediatrics

## 2020-11-09 ENCOUNTER — Other Ambulatory Visit: Payer: Self-pay

## 2020-11-09 VITALS — Ht 70.5 in | Wt 265.0 lb

## 2020-11-09 DIAGNOSIS — R569 Unspecified convulsions: Secondary | ICD-10-CM

## 2020-11-09 DIAGNOSIS — F840 Autistic disorder: Secondary | ICD-10-CM

## 2020-11-09 NOTE — Progress Notes (Signed)
Patient: PEDRAM GOODCHILD MRN: [id number] Sex: male DOB: 2001/04/25  Provider: Lorenz Coaster, MD Location of Care: [facility]  This is a Pediatric Specialist E-Visit follow up consult provided via Mychart video [name] and their parent/guardian consented to an E-Visit consult today.  Location of patient: [place] is at home Location of provider: Shaune Pascal is at home Patient was referred by Norm Salt, PA   The following participants were involved in this E-Visit: Lorre Munroe, CMA      Lorenz Coaster, MD   Note type: Routine follow-up  History of Present Illness:  HILMAR MOLDOVAN is a 20 y.o. male with history of  who I am seeing for routine follow-up. Patient was last seen on  where .  Since the last appointment, Inetta Fermo received calls about a breakthrough seizure and that Jeremi's mother had passed away.   Patient presents today with father.    He report Sione is struggling with loss of mother.  More aggressive, putting holes in house. They have spoken with Dr Jannifer Franklin, but no changes in medication from his standpoint.  He did go to grief counseling once to confirm to Jessey that she was gone and he didn't have to go to that house any more. Father reports periods of "mania" for a few hours per week.  He sleeps about 4 hours before waking up at night, then sleeps another 2-3 hours once he falls asleep.  He slept a lot after mother passed, but dad thinks it was maybe related to depression.  Now he's back to his regular schedule.  He always gets better sleep at dad's house.  He has done better with toilet training in the last week.    Regarding seizures, last seizure March 31.  None since then.  Waiting to see if he is able to go longer than 30 days now that father is primary parent. Previously had breakthrough seizures about once per month, but concern for medicine noncompliance with mom.    Past Medical  History Past Medical History:  Diagnosis Date  . Anxiety    Phreesia 02/02/2020  . Autism   . Depression    Phreesia 02/02/2020  . Prediabetes   . Seizures [facility])     Surgical History Past Surgical History:  Procedure Laterality Date  . TURBINATE REDUCTION      Family History family history includes Anxiety disorder in his mother; Bipolar disorder in his brother; Depression in his mother; Hypothyroidism in his mother; Migraines in his maternal grandmother; Sleep apnea in his father.   Social History Social History   Social History Narrative   Damen, is currently in school at [employer]. He is currently on the completion track. He lives with Dad during the school year. He has 1 older brother and a sibling that was given up for adoption.    Allergies No Known Allergies  Medications Current Outpatient Medications on File Prior to Visit  Medication Sig Dispense Refill  . cetirizine (ZYRTEC) 10 MG tablet Take 10 mg by mouth daily.    . clomiPRAMINE (ANAFRANIL) 75 MG capsule TAKE 2 CAPSULES BY MOUTH ONCE DAILY IN THE MORNING AND 1 AT BEDTIME FOR SEVERE OCD    . cloNIDine (CATAPRES) 0.2 MG tablet Take 0.2 mg by mouth 2 (two) times daily. Bedtime only (8:15p) if not resting within an hour then he gets second dose.    . hydrOXYzine (ATARAX/VISTARIL) 25 MG tablet Take 25 mg by mouth every  morning.    . metFORMIN (GLUCOPHAGE) 500 MG tablet Take 500 mg by mouth daily.    . risperiDONE (RISPERDAL) 3 MG tablet Take 3 mg by mouth 2 (two) times daily.    Marland Kitchen VALTOCO 20 MG DOSE 10 MG/0.1ML LQPK Place 1 spray into each nostril for seizure lasting more than 2 minutes 6 each 3   No current facility-administered medications on file prior to visit.   The medication list was reviewed and reconciled. All changes or newly prescribed medications were explained.  A complete medication list was provided to the patient/caregiver.  Physical Exam Ht 5' 10.5" (1.791 m) Comment: reported  Wt 265 lb  (120.2 kg) Comment: reported  BMI 37.49 kg/m  Facility age limit for growth percentiles is 20 years.  No exam data present     Diagnosis:There are no diagnoses linked to this encounter.   Assessment and Plan KARY COLAIZZI is a 20 y.o. male with history of who I am seeing in follow-up.     Return in about 3 months (around 02/08/2021).  Lorenz Coaster MD MPH Neurology and Neurodevelopment [facility] Child Neurology  [address] [place], [place], [place] [address] Phone: [phone]

## 2020-11-13 ENCOUNTER — Other Ambulatory Visit (INDEPENDENT_AMBULATORY_CARE_PROVIDER_SITE_OTHER): Payer: Self-pay | Admitting: Family

## 2020-11-13 DIAGNOSIS — F6381 Intermittent explosive disorder: Secondary | ICD-10-CM

## 2020-11-13 MED ORDER — GABAPENTIN 600 MG PO TABS: ORAL_TABLET | ORAL | 5 refills | Status: DC

## 2020-11-19 ENCOUNTER — Encounter (INDEPENDENT_AMBULATORY_CARE_PROVIDER_SITE_OTHER): Payer: Self-pay

## 2020-12-17 ENCOUNTER — Other Ambulatory Visit (INDEPENDENT_AMBULATORY_CARE_PROVIDER_SITE_OTHER): Payer: Self-pay | Admitting: Pediatrics

## 2020-12-17 NOTE — Telephone Encounter (Signed)
Please escribe

## 2020-12-18 ENCOUNTER — Other Ambulatory Visit (INDEPENDENT_AMBULATORY_CARE_PROVIDER_SITE_OTHER): Payer: Self-pay | Admitting: Family

## 2020-12-18 DIAGNOSIS — R569 Unspecified convulsions: Secondary | ICD-10-CM

## 2020-12-18 MED ORDER — LACOSAMIDE 100 MG PO TABS: ORAL_TABLET | ORAL | 1 refills | Status: DC

## 2020-12-21 ENCOUNTER — Telehealth (INDEPENDENT_AMBULATORY_CARE_PROVIDER_SITE_OTHER): Payer: Self-pay

## 2020-12-21 NOTE — Telephone Encounter (Signed)
Received fax from patients pharmacy requesting a refill for Vimpat 100mg .

## 2020-12-22 MED ORDER — LACOSAMIDE 100 MG PO TABS: 200.0000 mg | ORAL_TABLET | Freq: Two times a day (BID) | ORAL | 3 refills | Status: DC

## 2020-12-22 MED ORDER — PROPRANOLOL HCL ER 60 MG PO CP24: 60.0000 mg | ORAL_CAPSULE | Freq: Every morning | ORAL | 3 refills | Status: DC

## 2020-12-22 MED ORDER — TOPIRAMATE 200 MG PO TABS: 200.0000 mg | ORAL_TABLET | Freq: Two times a day (BID) | ORAL | 3 refills | Status: DC

## 2021-01-13 ENCOUNTER — Telehealth (INDEPENDENT_AMBULATORY_CARE_PROVIDER_SITE_OTHER): Payer: Medicaid Other | Admitting: Family

## 2021-01-13 VITALS — Wt 265.0 lb

## 2021-01-13 DIAGNOSIS — F840 Autistic disorder: Secondary | ICD-10-CM | POA: Diagnosis not present

## 2021-01-13 DIAGNOSIS — F911 Conduct disorder, childhood-onset type: Secondary | ICD-10-CM

## 2021-01-13 DIAGNOSIS — F6381 Intermittent explosive disorder: Secondary | ICD-10-CM

## 2021-01-13 DIAGNOSIS — R569 Unspecified convulsions: Secondary | ICD-10-CM | POA: Diagnosis not present

## 2021-01-13 NOTE — Progress Notes (Signed)
Brian Huerta   MRN:  [id number]  [birth year]   Provider: Elveria Rising NP-C Location of Care: [facility]  Visit type: Follow up  Last visit: 11/09/2020 Referral source: Norva Riffle, PA History from: Dad, [facility] Chart   This is a Pediatric Specialist E-Visit follow up consult provided via Caregility Brian Huerta and their parent/guardian Oluwatimileyin Vivier  consented to an E-Visit consult today.  Location of patient: Brian Huerta is at Home Location of provider: Elveria Rising NP is at [facility] (location) Patient was referred by Norm Salt, PA   The following participants were involved in this E-Visit: Mertie Moores, RMA Elveria Rising, NP [name] Allyn Kenner- dad  This visit was done via VIDEO   Chief Complain/ Reason for E-Visit today: Seizure follow up  Total time on call: 10 min Follow up: 3 months with Dr Artis Flock   Brief history:  Copied from previous record: History of autism, problems with behaviors, morbid obesity and seizure disorder. He is taking and tolerating Vimpat and Topiramate for his seizure disorder. His mother passed away unexpectedly on 11/05/2020, and Brian Huerta's behavior has worsened since then.   Today's concerns: Dad reports today that Brian Huerta had a seizure on June 14th, which was the first day of summer school. Dad believes the seizure was triggered by Olena Leatherwood being upset at returning to school, as he typically would have spent the summer with his mother. Dad says that the seizure lasted less than 2 minutes. He was assessed by EMS but was not taken to the ED.   Dad reports today that Brian Huerta has not been doing well since his mother's passing. He is displaying more rage and explosive behavior, alternating with bouts of crying. Dad says that Brian Huerta is always angry and has punched holes in the walls of his home, as well as other destructive behavior. Dad is very frustrated with Brian Huerta's behavior  and says that medications given by his psychiatrist have not been helpful.   Dad says that the school manages Brian Huerta's behavior with redirection and taking him for walks. He asked for a school medication administration form to be sent to his school for the Gabapentin doses given at school.   Brian Huerta has been otherwise generally healthy since he was last seen. Dad has no other health concerns for Brian Huerta today other than previously mentioned.  Review of systems: Please see HPI for neurologic and other pertinent review of systems. Otherwise all other systems were reviewed and were negative.  Problem List: Patient Active Problem List   Diagnosis Date Noted   Undersocialized conduct disorder, aggressive type, unspecified 09/30/2012   Autistic disorder 09/30/2012   Morbid obesity (HCC) 09/30/2012   Intermittent explosive disorder 09/30/2012     Past Medical History:  Diagnosis Date   Anxiety    Phreesia 02/02/2020   Autism    Depression    Phreesia 02/02/2020   Prediabetes    Seizures (HCC)     Past medical history comments: See HPI  Surgical history: Past Surgical History:  Procedure Laterality Date   TURBINATE REDUCTION      Family history: family history includes Anxiety disorder in his mother; Bipolar disorder in his brother; Depression in his mother; Hypothyroidism in his mother; Migraines in his maternal grandmother; Sleep apnea in his father.   Social history: Social History   Socioeconomic History   Marital status: Single    Spouse name: Not on file   Number of children: 0  Years of education: Not on file   Highest education level: Not on file  Occupational History   Not on file  Tobacco Use   Smoking status: Never    Passive exposure: Yes   Smokeless tobacco: Never  Vaping Use   Vaping Use: Never used  Substance and Sexual Activity   Alcohol use: Not Currently   Drug use: Not Currently   Sexual activity: Not on file  Other Topics Concern   Not on file   Social History Narrative   Brian Huerta, is currently in school at [employer]. He is currently on the completion track. He lives with his father. He has 1 older brother and a sibling that was given up for adoption. Lewie's mother passed away unexpectedly on 10-23-2020.   Social Determinants of Health   Financial Resource Strain: Not on file  Food Insecurity: Not on file  Transportation Needs: Not on file  Physical Activity: Not on file  Stress: Not on file  Social Connections: Not on file  Intimate Partner Violence: Not on file    Past/failed meds: Copied from previous record: zyprexa (paradoxical effect), ativan (paradoxical effect), valium (paradoxical effect).  Tried SSRIs, unsure what, "paradoxical response"  Allergies: No Known Allergies    Immunizations:  There is no immunization history on file for this patient.    Diagnostics/Screenings:   Physical Exam: Wt 265 lb (120.2 kg) Comment: at home weight  BMI 37.49 kg/m   General: well developed, well nourished obese young man, pacing at his home, in no evident distress Neurologic Exam Mental Status: awake and fully alert. Has no language. Pacing and yelling throughout the visit. Uncooperative.  Gait and Station: normal gait  Impression: Convulsions, unspecified convulsion type (HCC)  Undersocialized conduct disorder, aggressive type  Autistic disorder  Morbid obesity (HCC)  Intermittent explosive disorder   Recommendations for plan of care: The patient's previous [facility] records were reviewed. [name] has neither had nor required imaging or lab studies since the last visit. He is a 20 year old man with history of autism, aggressive behavior, intermittent explosive disorder, seizures and morbid obesity. He is taking and tolerating Vimpat and Topiramate for seizures. The last seizure occurred on December 29, 2020. [name]'s behavior is managed by his psychiatrist. He is having increased aggressive and explosive behavior  after the death of his mother in 17-Oct-2020. Dad is frustrated with his behavior and with medications being ineffective for seizures and behavior. He asked for an updated school medication administration form for the Gabapentin to be sent to the school, which I will do. I asked Dad to continue to let me know when seizures occur. Brian Huerta will return for follow up with Dr Artis Flock in 3 months or sooner if needed.   The medication list was reviewed and reconciled. No changes were made in the prescribed medications today. A complete medication list was provided to the patient.  Return in about 3 months (around 04/15/2021).   Allergies as of 01/13/2021   No Known Allergies      Medication List        Accurate as of January 13, 2021 11:59 PM. If you have any questions, ask your nurse or doctor.          cetirizine 10 MG tablet Commonly known as: ZYRTEC Take 10 mg by mouth daily.   clomiPRAMINE 75 MG capsule Commonly known as: ANAFRANIL TAKE 2 CAPSULES BY MOUTH ONCE DAILY IN THE MORNING AND 1 AT BEDTIME FOR SEVERE OCD  cloNIDine 0.2 MG tablet Commonly known as: CATAPRES Take 0.2 mg by mouth 2 (two) times daily. Bedtime only (8:15p) if not resting within an hour then he gets second dose.   gabapentin 600 MG tablet Commonly known as: NEURONTIN 600mg  (1 tablet) at 6am, 10am, 12:30pm and 5pm   hydrOXYzine 25 MG tablet Commonly known as: ATARAX/VISTARIL Take 25 mg by mouth every morning.   Lacosamide 100 MG Tabs Take 2 tablets (200 mg total) by mouth in the morning and at bedtime.   metFORMIN 500 MG tablet Commonly known as: GLUCOPHAGE Take 500 mg by mouth daily.   propranolol ER 60 MG 24 hr capsule Commonly known as: Inderal LA Take 1 capsule (60 mg total) by mouth in the morning.   risperiDONE 3 MG tablet Commonly known as: RISPERDAL Take 3 mg by mouth 2 (two) times daily.   topiramate 200 MG tablet Commonly known as: TOPAMAX Take 1 tablet (200 mg total) by mouth 2 (two)  times daily.   Valtoco 20 MG Dose 2 x 10 MG/0.1ML Lqpk Generic drug: diazePAM (20 MG Dose) Place 1 spray into each nostril for seizure lasting more than 2 minutes        I consulted with Dr regarding this patient.  Total time spent with the patient was 10 minutes, of which 50% or more was spent in counseling and coordination of care.  Artis Flock NP-C [facility] Child [facility] Ph. [phone] Fax [phone]

## 2021-01-15 ENCOUNTER — Encounter (INDEPENDENT_AMBULATORY_CARE_PROVIDER_SITE_OTHER): Payer: Self-pay | Admitting: Family

## 2021-01-15 DIAGNOSIS — R569 Unspecified convulsions: Secondary | ICD-10-CM | POA: Insufficient documentation

## 2021-01-15 NOTE — Patient Instructions (Signed)
Thank you for meeting with me by video today.   Instructions for you until your next appointment are as follows: Continue the seizure medications as prescribed Please continue to let me know when seizures occur.  Login should continue close follow up with his psychiatrist I will send the school medication form to the school for the Gabapentin Please sign up for MyChart if you have not done so. Please plan to return for follow up in 3 months to see Dr Artis Flock or sooner if needed.  At [facility], we are committed to providing exceptional care. You will receive a patient satisfaction survey through text or email regarding your visit today. Your opinion is important to me. Comments are appreciated.

## 2021-02-02 ENCOUNTER — Telehealth (INDEPENDENT_AMBULATORY_CARE_PROVIDER_SITE_OTHER): Payer: Self-pay | Admitting: Family

## 2021-02-02 NOTE — Telephone Encounter (Signed)
I received an email from Brian Huerta's Dad to report that he had a 2 1/2 min seizure yesterday. Stepmother heard him fall to the floor in the kitchen and found him down and in seizure. Dad said Valtoco was given and that it took awhile for him to respond. He had a headache afterwards and went sleep.   Dad said that he wanted to stay in bed this morning but that is common as he feels that Brian Huerta is very depressed. He is used to his mother caring for him after seizures and still wants that, not understanding that she is deceased. Dad says that Brian Huerta has "anxiety attacks" about his missing his mother and the thinks more seizures occur on Mondays because he gets upset and doesn't want to go to school on Mondays because his mother isn't there as she used to be.  Dad said that Brian Huerta is taking his medication and had good sleep on the weekend prior to the seizure.   Dr Artis Flock, do you have any recommendations?  Thanks, Inetta Fermo

## 2021-02-03 NOTE — Telephone Encounter (Signed)
I returned father's call.  He had a seizure a week ago, sleep schedule was messed up.  Less than 2 minutes, dad went and picked him up.  On Monday, seizure lasted longer than 2 minutes and required Valtoco. He was sleepy and depressed yesterday, now back to himself.  Dad reports he is documenting every seizure to [place].  Dad looking for triggers, including increasing fluids.    Dad continues to report that he wants his mother when he's at school and after he has a seizure. I encouraged dad to continue to support him during these periods. Dad does not believe what previous psychologists have told him, but he is trying to be more affectionate.   I advised increasing Vimpat. Dad would like to hold off for now.  This is fine with me as long as seizures are rare and they can find triggers.    Lorenz Coaster MD MPH

## 2021-03-15 ENCOUNTER — Telehealth (INDEPENDENT_AMBULATORY_CARE_PROVIDER_SITE_OTHER): Payer: Self-pay | Admitting: Family

## 2021-03-15 DIAGNOSIS — R569 Unspecified convulsions: Secondary | ICD-10-CM

## 2021-03-17 NOTE — Telephone Encounter (Signed)
Okey Dupre [place] M  You 43 minutes ago (3:13 PM)   BC Unable to make appt. Father was not willing to make the appt without speaking to [place] first. He stated that he is out currently with patient due to covid and can not keep taking off work.    Margurite Auerbach, MD routed conversation to You 23 hours ago (4:54 PM)   Bonita Quin  Artis Flock Elenore Paddy, MD; [facility] [address] Yesterday (9:08 AM)   Patient last seen by Inetta Fermo 01/13/2021 please escribe  Admin pool- please contact family and schedule appointment with Dr. Artis Flock in late September Early October *if possible.     Attached is the conversation from a refill request.

## 2021-03-18 NOTE — Telephone Encounter (Signed)
I called and talked with Dad. He said that [place] tested negative for Covid today and seems to feel better. He will return to school on 03/23/21 according to protocol. Dad says that [place] is doing well now and doesn't need to be seen. I told Dad that we can wait until January unless Kaelen has seizures or other problems and needs to be seen sooner. He agreed with this plan. TG

## 2021-05-10 ENCOUNTER — Other Ambulatory Visit (INDEPENDENT_AMBULATORY_CARE_PROVIDER_SITE_OTHER): Payer: Self-pay | Admitting: Pediatrics

## 2021-05-11 ENCOUNTER — Other Ambulatory Visit (INDEPENDENT_AMBULATORY_CARE_PROVIDER_SITE_OTHER): Payer: Self-pay | Admitting: Family

## 2021-05-11 MED ORDER — TOPIRAMATE 200 MG PO TABS: 200.0000 mg | ORAL_TABLET | Freq: Two times a day (BID) | ORAL | 5 refills | Status: DC

## 2021-05-17 ENCOUNTER — Other Ambulatory Visit (INDEPENDENT_AMBULATORY_CARE_PROVIDER_SITE_OTHER): Payer: Self-pay | Admitting: Family

## 2021-05-17 ENCOUNTER — Telehealth (INDEPENDENT_AMBULATORY_CARE_PROVIDER_SITE_OTHER): Payer: Self-pay | Admitting: Family

## 2021-05-17 DIAGNOSIS — R569 Unspecified convulsions: Secondary | ICD-10-CM

## 2021-05-17 MED ORDER — LACOSAMIDE 100 MG PO TABS: ORAL_TABLET | ORAL | 5 refills | Status: DC

## 2021-05-17 MED ORDER — VIMPAT 100 MG PO TABS: 200.0000 mg | ORAL_TABLET | Freq: Two times a day (BID) | ORAL | 5 refills | Status: DC

## 2021-05-17 NOTE — Telephone Encounter (Signed)
The brand Vimpat is preferred on Medicaid. I sent a new prescription to the pharmacy with brand medically necessary on it. TG

## 2021-05-17 NOTE — Telephone Encounter (Signed)
  Who's calling (name and relationship to patient) : [facility]  Best contact number: [phone]  Provider they see: Elveria Rising  Reason for call: Pharmacy states that Vimpat needs prior authorization.    PRESCRIPTION REFILL ONLY  Name of prescription: Vimpat  Pharmacy:  [facility] [address] [place], [place] - [address]

## 2021-05-24 ENCOUNTER — Telehealth (INDEPENDENT_AMBULATORY_CARE_PROVIDER_SITE_OTHER): Payer: Self-pay | Admitting: Family

## 2021-05-24 NOTE — Telephone Encounter (Signed)
Attempted to call parents, to let them know that the pharmacy in [place] does not have the brand vimpat medication. [facility] [address] and they will be able to fill that today. Not able to leave vm.

## 2021-05-24 NOTE — Telephone Encounter (Signed)
  Who's calling (name and relationship to patient) :Dad/ Carole Binning contact number:[phone]  Provider they XAJ:OINO Goodpasture   Reason for call:Dad called stating that he is having the hardest time getting the prior auth for the generic Vimpat. Pharmacy going back and forth with [facility] and dad stated that at this appointment he will be out of medication by tomorrow and doesn't know what to do. Reference # [id number]      PRESCRIPTION REFILL ONLY  Name of prescription:  Pharmacy:

## 2021-05-24 NOTE — Telephone Encounter (Signed)
  [facility]   Best contact number: [phone] Provider they see: [facility] Reason for call: Checking on PA patient  will be out of medication tomorrow and brand version is not a options     PRESCRIPTION REFILL ONLY  Name of prescription: vimpat [facility]

## 2021-05-24 NOTE — Telephone Encounter (Signed)
See other note

## 2021-05-24 NOTE — Telephone Encounter (Signed)
Dad called back he states understanding that [facility] [address] will fill his prescription, he also states that he wants this issue with brand vimpat fixed before he fills the medication again to his understanding he has been getting generic vimpat and that was the issue, and now brand vimpat is preferred by medicaid and he is still having issues.

## 2021-06-04 ENCOUNTER — Other Ambulatory Visit (INDEPENDENT_AMBULATORY_CARE_PROVIDER_SITE_OTHER): Payer: Self-pay | Admitting: Family

## 2021-06-04 DIAGNOSIS — R569 Unspecified convulsions: Secondary | ICD-10-CM

## 2021-06-04 MED ORDER — LACOSAMIDE 100 MG PO TABS: 200.0000 mg | ORAL_TABLET | Freq: Two times a day (BID) | ORAL | 5 refills | Status: DC

## 2021-07-02 ENCOUNTER — Other Ambulatory Visit (INDEPENDENT_AMBULATORY_CARE_PROVIDER_SITE_OTHER): Payer: Self-pay | Admitting: Pediatrics

## 2021-08-23 ENCOUNTER — Other Ambulatory Visit: Payer: Self-pay

## 2021-08-23 ENCOUNTER — Ambulatory Visit (INDEPENDENT_AMBULATORY_CARE_PROVIDER_SITE_OTHER): Payer: Medicaid Other | Admitting: Family

## 2021-08-23 ENCOUNTER — Encounter (INDEPENDENT_AMBULATORY_CARE_PROVIDER_SITE_OTHER): Payer: Self-pay | Admitting: Family

## 2021-08-23 VITALS — BP 114/68 | Wt 241.0 lb

## 2021-08-23 DIAGNOSIS — F911 Conduct disorder, childhood-onset type: Secondary | ICD-10-CM

## 2021-08-23 DIAGNOSIS — R569 Unspecified convulsions: Secondary | ICD-10-CM

## 2021-08-23 DIAGNOSIS — F840 Autistic disorder: Secondary | ICD-10-CM | POA: Diagnosis not present

## 2021-08-23 DIAGNOSIS — F6381 Intermittent explosive disorder: Secondary | ICD-10-CM

## 2021-08-26 NOTE — Patient Instructions (Signed)
It was a pleasure to see you today!  Instructions for you until your next appointment are as follows: Continue giving Brian Huerta's medications as prescribed Continue to let me know when seizures occur Please sign up for MyChart if you have not done so. Please plan to return for follow up in 6 months or sooner if needed.    Feel free to contact our office during normal business hours at [phone] with questions or concerns. If there is no answer or the call is outside business hours, please leave a message and our clinic staff will call you back within the next business day.  If you have an urgent concern, please stay on the line for our after-hours answering service and ask for the on-call neurologist.     I also encourage you to use MyChart to communicate with me more directly. If you have not yet signed up for MyChart within [facility], the front desk staff can help you. However, please note that this inbox is NOT monitored on nights or weekends, and response can take up to 2 business days.  Urgent matters should be discussed with the on-call pediatric neurologist.   At [facility], we are committed to providing exceptional care. You will receive a patient satisfaction survey through text or email regarding your visit today. Your opinion is important to me. Comments are appreciated.

## 2021-08-26 NOTE — Progress Notes (Signed)
Brian Huerta   MRN:  [id number]  [birth year]   Provider: Elveria Rising NP-C Location of Care: [facility] Child Neurology  Visit type: return visit  Last visit: 01/13/21  Referral source: Norva Riffle, PA History from: Epic chart and patient's father  Brief history:  Copied from previous record: History of autism, problems with behaviors, morbid obesity and seizure disorder. He is taking and tolerating Vimpat and Topiramate for his seizure disorder. His mother passed away unexpectedly on 10/10/2020, and Brian Huerta's behavior has worsened since then. Brian Huerta is followed by psychiatry for anxiety and behavior.  Today's concerns: Dad reports today that Brian Huerta has had 6 seizures since his last visit. Typically the seizures occur when he is emotionally upset or when he has missed sleep. Dad says that Brian Huerta receives the Vimpat and Topiramate regularly. None of the seizures have lasted more than 2-3 minutes. He has Valtoco for rescue treatment when they occur.   Brian Huerta continues to have frequent emotional outbursts and can be aggressive, particularly with women. He often becomes upset and looks for his mother (who is deceased). Today in the exam room he went to his father several times and said "Mama".  He is restless in the exam room and Dad notes that he is actually quieter than usual because of receiving medications prior to coming to the visit.   Brian Huerta has lost considerable weight since being solely in his father's care. He has tried to provide him with healthy meals and limit snacks.   Brian Huerta is in school and Dad says that the school manages his behavior fairly well. Dad reports that there are problems with his behavior with his stepmother and that he can be aggressive with her. Dad has significant health problems of his own and admits that Brian Huerta's care can be overwhelming. He says that Brian Huerta is on the [employer] but that he knows that he will not  get assistance with him for many years.   Brian Huerta has been otherwise generally healthy since he was last seen. Dad has no other health concerns for Brian Huerta today other than previously mentioned.  Review of systems: Please see HPI for neurologic and other pertinent review of systems. Otherwise all other systems were reviewed and were negative.  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   Autistic disorder 09/30/2012   Morbid obesity (HCC) 09/30/2012   Intermittent explosive disorder 09/30/2012     Past Medical History:  Diagnosis Date   Anxiety    Phreesia 02/02/2020   Autism    Depression    Phreesia 02/02/2020   Prediabetes    Seizures (HCC)     Past medical history comments: See HPI  Surgical history: Past Surgical History:  Procedure Laterality Date   TURBINATE REDUCTION      Family history: family history includes Anxiety disorder in his mother; Bipolar disorder in his brother; Depression in his mother; Hypothyroidism in his mother; Migraines in his maternal grandmother; Sleep apnea in his father.   Social history: Social History   Socioeconomic History   Marital status: Single    Spouse name: Not on file   Number of children: 0   Years of education: Not on file   Highest education level: Not on file  Occupational History   Not on file  Tobacco Use   Smoking status: Never    Passive exposure: Yes  Smokeless tobacco: Never  Vaping Use   Vaping Use: Never used  Substance and Sexual Activity   Alcohol use: Not Currently   Drug use: Not Currently   Sexual activity: Not on file  Other Topics Concern   Not on file  Social History Narrative   Brian Huerta, is currently in school at [employer]. He is currently on the completion track. He lives with his father. He has 1 older brother and a sibling that was given up for adoption. Brian Huerta's mother  passed away unexpectedly on 10/23/20.   Social Determinants of Health   Financial Resource Strain: Not on file  Food Insecurity: Not on file  Transportation Needs: Not on file  Physical Activity: Not on file  Stress: Not on file  Social Connections: Not on file  Intimate Partner Violence: Not on file    Past/failed meds: Copied from previous record: zyprexa (paradoxical effect), ativan (paradoxical effect), valium (paradoxical effect).  Tried SSRIs, unsure what, "paradoxical response"  Allergies: No Known Allergies   Immunizations:  There is no immunization history on file for this patient.   Diagnostics/Screenings:  Physical Exam: BP 114/68    Wt 241 lb (109.3 kg)    BMI 34.09 kg/m   Wt Readings from Last 3 Encounters:  08/23/21 241 lb (109.3 kg)  01/13/21 265 lb (120.2 kg)  11/09/20 265 lb (120.2 kg)   Examination was limited by patient's restless behavior General: obese well developed, well nourished young man, restless in the exam room, in no evident distress Head: normocephalic and atraumatic. No dysmorphic features. Neck: supple Musculoskeletal: no skeletal deformities or obvious scoliosis.  Skin: no rashes or neurocutaneous lesions  Neurologic Exam Mental Status: awake and fully alert. Has very limited language. Restless and pacing in the exam room. Occasionally lies down on the floor. Resistant to invasions in to his space Cranial Nerves: turns to localize faces and objects in the periphery. Turns to localize sounds in the periphery. Facial movements are symmetric. Motor: normal functional bulk, tone and strength Sensory: withdrawal x 4 Coordination: unable to adequately assess due to patient's inability to participate in examination. No dysmetria when reaching for objects. Gait and Station: able to stand, bear weight and walk normally  Impression: Seizures (HCC)  Undersocialized conduct disorder, aggressive type  Autistic disorder  Intermittent  explosive disorder  Morbid obesity (HCC)   Recommendations for plan of care: The patient's previous [facility] records were reviewed. Brian Huerta has neither had nor required imaging or lab studies since the last visit. He is a 21 year old young man with autism, seizures, anxiety, problems with behavior, prolonged bereavement about his mother's passing and obesity. He is taking and tolerating Vimpat and Topiramate for his seizure disorder. Brian Huerta tends to have seizures when he is upset or when he has missed sleep. Dad is doing well with managing his behavior and conditions but is overwhelmed with Brian Huerta's needs. I asked Dad to continue to let me know when seizures occur. I will see Brian Huerta back in follow up in 6 months or sooner if needed.   The medication list was reviewed and reconciled. No changes were made in the prescribed medications today. A complete medication list was provided to the patient.  Return in about 6 months (around 02/20/2022).   Allergies as of 08/23/2021   No Known Allergies      Medication List        Accurate as of August 23, 2021 11:59 PM. If you have any questions, ask your  nurse or doctor.          cetirizine 10 MG tablet Commonly known as: ZYRTEC Take 10 mg by mouth daily.   clomiPRAMINE 75 MG capsule Commonly known as: ANAFRANIL TAKE 2 CAPSULES BY MOUTH ONCE DAILY IN THE MORNING AND 1 AT BEDTIME FOR SEVERE OCD   cloNIDine 0.2 MG tablet Commonly known as: CATAPRES Take 0.2 mg by mouth 2 (two) times daily. Bedtime only (8:15p) if not resting within an hour then he gets second dose.   gabapentin 600 MG tablet Commonly known as: NEURONTIN 600mg  (1 tablet) at 6am, 10am, 12:30pm and 5pm   hydrOXYzine 50 MG tablet Commonly known as: ATARAX Take 50 mg by mouth every morning. What changed: Another medication with the same name was removed. Continue taking this medication, and follow the directions you see here. Changed by: , NP   Lacosamide  100 MG Tabs Commonly known as: Vimpat Take 2 tablets (200 mg total) by mouth 2 (two) times daily.   metFORMIN 500 MG tablet Commonly known as: GLUCOPHAGE Take 500 mg by mouth daily.   propranolol ER 60 MG 24 hr capsule Commonly known as: INDERAL LA Take 1 capsule by mouth in the morning   risperiDONE 3 MG tablet Commonly known as: RISPERDAL Take 3 mg by mouth 2 (two) times daily.   topiramate 200 MG tablet Commonly known as: TOPAMAX Take 1 tablet (200 mg total) by mouth 2 (two) times daily.   Valtoco 20 MG Dose 2 x 10 MG/0.1ML Lqpk Generic drug: diazePAM (20 MG Dose) Place 1 spray into each nostril for seizure lasting more than 2 minutes      Total time spent with the patient was 20 minutes, of which 50% or more was spent in counseling and coordination of care.  Elveria Rising NP-C [facility] Child [facility]rology Ph. [phone] Fax [phone]

## 2021-08-29 ENCOUNTER — Encounter (INDEPENDENT_AMBULATORY_CARE_PROVIDER_SITE_OTHER): Payer: Self-pay | Admitting: Family

## 2021-09-04 ENCOUNTER — Other Ambulatory Visit (INDEPENDENT_AMBULATORY_CARE_PROVIDER_SITE_OTHER): Payer: Self-pay | Admitting: Family

## 2021-09-16 ENCOUNTER — Other Ambulatory Visit (INDEPENDENT_AMBULATORY_CARE_PROVIDER_SITE_OTHER): Payer: Self-pay | Admitting: Family

## 2021-09-16 DIAGNOSIS — R569 Unspecified convulsions: Secondary | ICD-10-CM

## 2021-09-16 MED ORDER — VALTOCO 20 MG DOSE 10 MG/0.1ML NA LQPK: NASAL | 5 refills | Status: DC

## 2021-10-16 ENCOUNTER — Other Ambulatory Visit (INDEPENDENT_AMBULATORY_CARE_PROVIDER_SITE_OTHER): Payer: Self-pay | Admitting: Pediatrics

## 2021-10-16 DIAGNOSIS — F6381 Intermittent explosive disorder: Secondary | ICD-10-CM

## 2021-12-22 ENCOUNTER — Other Ambulatory Visit (INDEPENDENT_AMBULATORY_CARE_PROVIDER_SITE_OTHER): Payer: Self-pay | Admitting: Family

## 2021-12-22 DIAGNOSIS — R569 Unspecified convulsions: Secondary | ICD-10-CM

## 2021-12-23 ENCOUNTER — Other Ambulatory Visit (INDEPENDENT_AMBULATORY_CARE_PROVIDER_SITE_OTHER): Payer: Self-pay | Admitting: Family

## 2021-12-23 DIAGNOSIS — R569 Unspecified convulsions: Secondary | ICD-10-CM

## 2021-12-23 MED ORDER — LACOSAMIDE 100 MG PO TABS: 200.0000 mg | ORAL_TABLET | Freq: Two times a day (BID) | ORAL | 5 refills | Status: DC

## 2022-01-13 ENCOUNTER — Telehealth (INDEPENDENT_AMBULATORY_CARE_PROVIDER_SITE_OTHER): Payer: Medicaid Other | Admitting: Family

## 2022-01-13 ENCOUNTER — Encounter (INDEPENDENT_AMBULATORY_CARE_PROVIDER_SITE_OTHER): Payer: Self-pay | Admitting: Family

## 2022-01-13 VITALS — Wt 245.0 lb

## 2022-01-13 DIAGNOSIS — R569 Unspecified convulsions: Secondary | ICD-10-CM

## 2022-01-13 DIAGNOSIS — F6381 Intermittent explosive disorder: Secondary | ICD-10-CM | POA: Diagnosis not present

## 2022-01-13 DIAGNOSIS — F911 Conduct disorder, childhood-onset type: Secondary | ICD-10-CM

## 2022-01-13 DIAGNOSIS — F840 Autistic disorder: Secondary | ICD-10-CM | POA: Diagnosis not present

## 2022-01-13 NOTE — Progress Notes (Signed)
This is a Pediatric [facility]ist E-Visit consult/follow up provided via My Chart DONTAYE HUR and his father Allena Earing consented to an E-Visit consult today.  Location of patient: Cris is at home Location of provider: Damita Dunnings is at office Patient was referred by Norm Salt, PA   The following participants were involved in this E-Visit: CMA, NP, patient's father  This visit was done via VIDEO   Chief Complain/ Reason for E-Visit today: autism, behavior and seizures Total time on call: 15 min Follow up: August 2023   Brian Huerta   MRN:  [id number]  08/04/2000   Provider: Elveria Rising NP-C Location of Care: [facility] Child Neurology  Visit type: Video return visit  Last visit: 08/23/2021  Referral source: Norm Salt, PA  History from: Epic chart, patient's father  Brief history:  Copied from previous record: History of autism, problems with behaviors, morbid obesity and seizure disorder. He is taking and tolerating Vimpat and Topiramate for his seizure disorder. His mother passed away unexpectedly on 11-04-20, and Jacksen's behavior has worsened since then. Andranik is followed by psychiatry for anxiety and behavior.  Today's concerns: Cordaryl is seen today in virtual visit because of difficulty in bringing him into the office due to his behavior. Dad reports today that Burnette continues to have fairly frequent seizures, about every couple of weeks. These tend to occur when he is emotionally upset, as he continues to cry and search for mother, who is deceased. He has occasional seizures in sleep. Cray can have urinary incontinence with the seizures.   Dad reports that Jovanne's behavior can be variable. He is generally always active, pacing and self directed in his acitivities. He can become angry and strike out at objects more so than people as he can be easily frustrated   Raekwan continues to search and call out for  his mother. His father has tried to work at comforting him but it does not always help.   Demetres's father has serious health problems at this time as he is dealing with his own diagnosis of CLL (leukemia) and his stepmother has upcoming hip surgery in August. Dad is worried about finding care for [facility].  Chaun has been otherwise generally healthy since he was last seen. Dad has no other health concerns for Raylin today other than previously mentioned.  Review of systems: Please see HPI for neurologic and other pertinent review of systems. Otherwise all other systems were reviewed and were negative.  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   Autistic disorder 09/30/2012   Morbid obesity (HCC) 09/30/2012   Intermittent explosive disorder 09/30/2012     Past Medical History:  Diagnosis Date   Anxiety    Phreesia 02/02/2020   Autism    Depression    Phreesia 02/02/2020   Prediabetes    Seizures (HCC)     Past medical history comments: See HPI  Surgical history: Past Surgical History:  Procedure Laterality Date   TURBINATE REDUCTION       Family history: family history includes Anxiety disorder in his mother; Bipolar disorder in his brother; Depression in his mother; Hypothyroidism in his mother; Migraines in his maternal grandmother; Sleep apnea in his father.   Social history: Social History   Socioeconomic History   Marital status: Single    Spouse name: Not on file   Number  of children: 0   Years of education: Not on file   Highest education level: Not on file  Occupational History   Not on file  Tobacco Use   Smoking status: Never    Passive exposure: Yes   Smokeless tobacco: Never  Vaping Use   Vaping Use: Never used  Substance and Sexual Activity   Alcohol use: Not Currently   Drug use: Not Currently   Sexual activity: Not on file   Other Topics Concern   Not on file  Social History Narrative   Kelson, is currently in school at [employer]. He is currently on the completion track. He lives with his father. He has 1 older brother and a sibling that was given up for adoption. Carrol's mother passed away unexpectedly on 20-Oct-2020.   Social Determinants of Health   Financial Resource Strain: Not on file  Food Insecurity: Not on file  Transportation Needs: Not on file  Physical Activity: Not on file  Stress: Not on file  Social Connections: Not on file  Intimate Partner Violence: Not on file    Past/failed meds: Copied from previous record: zyprexa (paradoxical effect), ativan (paradoxical effect), valium (paradoxical effect).  Tried SSRIs, unsure what, "paradoxical response"  Allergies: No Known Allergies    Immunizations:  There is no immunization history on file for this patient.    Diagnostics/Screenings:   Physical Exam: Wt 245 lb (111.1 kg)   BMI 34.66 kg/m   General: well developed, well nourished young man, active in his home, in no evident distress Head: normocephalic and atraumatic. No dysmorphic features. Neck: supple Musculoskeletal: no skeletal deformities or obvious scoliosis.  Skin: no rashes or neurocutaneous lesions  Neurologic Exam Mental Status: awake and fully alert. Has no language. Pacing and making vocalizations in his home Cranial Nerves: turns to localize faces and objects in the periphery. Turns to localize sounds in the periphery. Facial movements are symmetric Motor: normal functional bulk, tone and strength Sensory: withdrawal x 4 Coordination: unable to adequately assess due to patient's inability to participate in examination. No dysmetria when reaching for objects. Gait and Station: able to stand and walk independently  Impression: Autistic disorder  Undersocialized conduct disorder, aggressive type  Intermittent explosive disorder  Seizures (HCC)    Recommendations for plan of care: The patient's previous Epic records were reviewed. Ell has neither had nor required imaging or lab studies since the last visit. He has problems with behavior, and seizures tend to occur when he is emotionally upset. Dad and Nicholes's step-mother have health problems and Dad is worried about finding care for Advance. I will update his application for personal care services today. We talked about Natthew's seizures but decided against not making changes in his treatment plan at this time. I will see Vahe back in follow up in 3 months or sooner if needed.   The medication list was reviewed and reconciled. No changes were made in the prescribed medications today. A complete medication list was provided to the patient.  Return in about 3 months (around 04/15/2022).   Allergies as of 01/13/2022   No Known Allergies      Medication List        Accurate as of January 13, 2022  2:40 PM. If you have any questions, ask your nurse or doctor.          cetirizine 10 MG tablet Commonly known as: ZYRTEC Take 10 mg by mouth daily.   clomiPRAMINE 75 MG capsule Commonly  known as: ANAFRANIL TAKE 2 CAPSULES BY MOUTH ONCE DAILY IN THE MORNING AND 1 AT BEDTIME FOR SEVERE OCD   cloNIDine 0.2 MG tablet Commonly known as: CATAPRES Take 0.2 mg by mouth 2 (two) times daily. Bedtime only (8:15p) if not resting within an hour then he gets second dose.   gabapentin 600 MG tablet Commonly known as: NEURONTIN TAKE 1 TABLET BY MOUTH 4 TIMES DAILY AT 6AM,  10AM, 12:30PM, AND 5PM   hydrOXYzine 50 MG tablet Commonly known as: ATARAX Take 50 mg by mouth every morning.   Lacosamide 100 MG Tabs Commonly known as: Vimpat Take 2 tablets (200 mg total) by mouth 2 (two) times daily.   metFORMIN 500 MG tablet Commonly known as: GLUCOPHAGE Take 500 mg by mouth daily.   propranolol ER 60 MG 24 hr capsule Commonly known as: INDERAL LA Take 1 capsule by mouth in the  morning   risperiDONE 3 MG tablet Commonly known as: RISPERDAL Take 3 mg by mouth 2 (two) times daily.   topiramate 200 MG tablet Commonly known as: TOPAMAX Take 1 tablet (200 mg total) by mouth 2 (two) times daily.   Valtoco 20 MG Dose 2 x 10 MG/0.1ML Lqpk Generic drug: diazePAM (20 MG Dose) Place 1 spray into each nostril for seizure lasting more than 2 minutes      Total time spent with the patient was 15 minutes, of which 50% or more was spent in counseling and coordination of care.  Elveria Rising NP-C [facility] Neurology Ph. [phone] Fax [phone]

## 2022-01-13 NOTE — Patient Instructions (Signed)
It was a pleasure to see you today!  Instructions for you until your next appointment are as follows: I will complete the form for [facility] and resubmit it ASAP.  Please continue to let me know when Bela has a seizure or if you have other concerns. Please sign up for MyChart if you have not done so. Please plan to return for follow up in 3 months or sooner if needed.  Feel free to contact our office during normal business hours at [phone] with questions or concerns. If there is no answer or the call is outside business hours, please leave a message and our clinic staff will call you back within the next business day.  If you have an urgent concern, please stay on the line for our after-hours answering service and ask for the on-call neurologist.     I also encourage you to use MyChart to communicate with me more directly. If you have not yet signed up for MyChart within [facility], the front desk staff can help you. However, please note that this inbox is NOT monitored on nights or weekends, and response can take up to 2 business days.  Urgent matters should be discussed with the on-call pediatric neurologist.   At [facility], we are committed to providing exceptional care. You will receive a patient satisfaction survey through text or email regarding your visit today. Your opinion is important to me. Comments are appreciated.

## 2022-01-14 ENCOUNTER — Encounter (INDEPENDENT_AMBULATORY_CARE_PROVIDER_SITE_OTHER): Payer: Self-pay | Admitting: Family

## 2022-02-21 ENCOUNTER — Ambulatory Visit (INDEPENDENT_AMBULATORY_CARE_PROVIDER_SITE_OTHER): Payer: Medicaid Other | Admitting: Family

## 2022-02-24 ENCOUNTER — Other Ambulatory Visit (INDEPENDENT_AMBULATORY_CARE_PROVIDER_SITE_OTHER): Payer: Self-pay | Admitting: Family

## 2022-02-24 DIAGNOSIS — F840 Autistic disorder: Secondary | ICD-10-CM

## 2022-02-24 DIAGNOSIS — F911 Conduct disorder, childhood-onset type: Secondary | ICD-10-CM

## 2022-02-24 NOTE — Telephone Encounter (Signed)
Seen 01/13/22 requested front call and sched follow up in Sept- 1 refill given

## 2022-04-15 ENCOUNTER — Other Ambulatory Visit (INDEPENDENT_AMBULATORY_CARE_PROVIDER_SITE_OTHER): Payer: Self-pay | Admitting: Family

## 2022-04-15 DIAGNOSIS — F840 Autistic disorder: Secondary | ICD-10-CM

## 2022-04-15 DIAGNOSIS — F911 Conduct disorder, childhood-onset type: Secondary | ICD-10-CM

## 2022-04-15 NOTE — Telephone Encounter (Signed)
Seen 12/2021 was to follow up in Sept- message to front office to call and sched follow up 30 day supply sent

## 2022-05-10 ENCOUNTER — Encounter (INDEPENDENT_AMBULATORY_CARE_PROVIDER_SITE_OTHER): Payer: Self-pay

## 2022-05-13 NOTE — Progress Notes (Addendum)
We need information from father to support FMLA.  Paperwork left in [place] box to complete.    Carylon Perches MD MPH

## 2022-05-16 NOTE — Progress Notes (Signed)
Completed FMLA paperwork found and faxed to employer.   Carylon Perches MD MPH

## 2022-05-18 ENCOUNTER — Telehealth (INDEPENDENT_AMBULATORY_CARE_PROVIDER_SITE_OTHER): Payer: Medicaid Other | Admitting: Family

## 2022-05-18 ENCOUNTER — Encounter (INDEPENDENT_AMBULATORY_CARE_PROVIDER_SITE_OTHER): Payer: Self-pay | Admitting: Family

## 2022-05-18 DIAGNOSIS — F840 Autistic disorder: Secondary | ICD-10-CM

## 2022-05-18 DIAGNOSIS — R569 Unspecified convulsions: Secondary | ICD-10-CM

## 2022-05-18 DIAGNOSIS — F911 Conduct disorder, childhood-onset type: Secondary | ICD-10-CM

## 2022-05-18 DIAGNOSIS — F6381 Intermittent explosive disorder: Secondary | ICD-10-CM

## 2022-05-18 MED ORDER — GABAPENTIN 600 MG PO TABS: ORAL_TABLET | ORAL | 5 refills | Status: DC

## 2022-05-18 MED ORDER — PROPRANOLOL HCL ER 60 MG PO CP24: 60.0000 mg | ORAL_CAPSULE | Freq: Every morning | ORAL | 5 refills | Status: DC

## 2022-05-18 MED ORDER — LACOSAMIDE 100 MG PO TABS: 200.0000 mg | ORAL_TABLET | Freq: Two times a day (BID) | ORAL | 5 refills | Status: DC

## 2022-05-18 MED ORDER — TOPIRAMATE 200 MG PO TABS: 200.0000 mg | ORAL_TABLET | Freq: Two times a day (BID) | ORAL | 5 refills | Status: DC

## 2022-05-18 NOTE — Patient Instructions (Signed)
It was a pleasure to see you today!  Instructions for you until your next appointment are as follows: Continue giving Tranell's medications as prescribed Let me know if you have any questions or concerns Please sign up for MyChart if you have not done so. Please plan to return for follow up in 4 months or sooner if needed.  Feel free to contact our office during normal business hours at [phone] with questions or concerns. If there is no answer or the call is outside business hours, please leave a message and our clinic staff will call you back within the next business day.  If you have an urgent concern, please stay on the line for our after-hours answering service and ask for the on-call neurologist.     I also encourage you to use MyChart to communicate with me more directly. If you have not yet signed up for MyChart within [facility], the front desk staff can help you. However, please note that this inbox is NOT monitored on nights or weekends, and response can take up to 2 business days.  Urgent matters should be discussed with the on-call pediatric neurologist.   At [facility], we are committed to providing exceptional care. You will receive a patient satisfaction survey through text or email regarding your visit today. Your opinion is important to me. Comments are appreciated.

## 2022-05-18 NOTE — Progress Notes (Signed)
This is a Pediatric Specialist E-Visit consult/follow up provided via My [place] and his father Brian Huerta consented to an E-Visit consult today.  Location of patient: Brian Huerta is at home Location of provider: Normand Huerta is at office Patient was referred by Brian Sailors, PA   The following participants were involved in this E-Visit: CMA, NP, father of patient and patient  This visit was done via [place] Complain/ Reason for E-Visit today: seizure follow up Total time on call: 10 min Follow up: 4 months   Brian Huerta   MRN:  [id number]  Jul 16, 2001   Provider: Rockwell Germany NP-C Location of Care: [facility]  Visit type: Return visit  Last visit: 01/13/2022  Referral source: Brian Sailors, PA  History from: Epic chart, patient's father  Brief history:  Copied from previous record: History of autism, problems with behaviors, morbid obesity and seizure disorder. He is taking and tolerating Vimpat and Topiramate for his seizure disorder. His mother passed away unexpectedly on 08-Oct-2020, and Brian Huerta's behavior has worsened since then. Brian Huerta is followed by psychiatry for anxiety and behavior.   Today's concerns: Dad reports today that Brian Huerta has remained seizure free since July 2023. He continues to have problems with behavior, including hitting others and yelling. He has been approved for [employer] and had an intake appointment yesterday for a day program. Dad notes that as part of the [employer] Waiver that he needs to find a PCP and a Pharmacist, community for [name].   Brian Huerta has been otherwise generally healthy since he was last seen. Dad has no other health concerns for Brian Huerta today other than previously mentioned.  Review of systems: Please see HPI for neurologic and other pertinent review of systems. Otherwise all other systems were reviewed and were negative.  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   Autistic disorder 09/30/2012   Morbid obesity ([place]) 09/30/2012   Intermittent explosive disorder 09/30/2012     Past Medical History:  Diagnosis Date   Anxiety    Phreesia 02/02/2020   Autism    Depression    Phreesia 02/02/2020   Prediabetes    Seizures ([place])     Past medical history comments: See HPI   Surgical history: Past Surgical History:  Procedure Laterality Date   TURBINATE REDUCTION       Family history: family history includes Anxiety disorder in his mother; Bipolar disorder in his brother; Depression in his mother; Hypothyroidism in his mother; Migraines in his maternal grandmother; Sleep apnea in his father.   Social history: Social History   Socioeconomic History   Marital status: Single    Spouse name: Not on file   Number of children: 0   Years of education: Not on file   Highest education level: Not on file  Occupational History   Not on file  Tobacco Use   Smoking status: Never    Passive exposure: Yes   Smokeless tobacco: Never  Vaping Use   Vaping Use: Never used  Substance and Sexual Activity   Alcohol use: Not Currently   Drug use: Not Currently   Sexual activity: Not on file  Other Topics Concern   Not on file  Social History Narrative   He lives with his father and step mother.    He has 1 older brother and a sibling  that was given up for adoption.    Pearson's mother passed away unexpectedly on 2020/10/07.   Social Determinants of Health   Financial Resource Strain: Not on file  Food Insecurity: Not on file  Transportation Needs: Not on file  Physical Activity: Not on file  Stress: Not on file  Social Connections: Not on file  Intimate Partner Violence: Not on file    Past/failed meds: Copied from previous record: Zyprexa (paradoxical effect), ativan (paradoxical effect), valium (paradoxical  effect).  Tried SSRIs, unsure what, "paradoxical response"   Allergies: No Known Allergies   Immunizations:  There is no immunization history on file for this patient.    Diagnostics/Screenings:  Physical Exam: There were no vitals taken for this visit.  Examination was limited by video format and patient's inability to cooperate with examination. General: well developed, well nourished, active in his home Head: normocephalic and atraumatic. No dysmorphic features. Neck: supple Musculoskeletal: no skeletal deformities or obvious scoliosis.  Skin: no rashes or neurocutaneous lesions  Neurologic Exam Mental Status: awake and fully alert. Has no language.  Agitated during the visit, yelling and hitting furniture and cabinets in his home. Cranial Nerves: turns to localize faces and objects in the periphery. Turns to localize sounds in the periphery. Facial movements are symmetric Motor: normal functional bulk, tone and strength  Sensory: withdrawal x 4 Coordination: unable to adequately assess due to patient's inability to participate in examination. No dysmetria when reaching for objects. Gait and Station: able to walk independently   Impression: Seizures (HCC)  Convulsions, unspecified convulsion type (HCC) - Plan: Lacosamide (VIMPAT) 100 MG TABS, topiramate (TOPAMAX) 200 MG tablet  Intermittent explosive disorder - Plan: gabapentin (NEURONTIN) 600 MG tablet  Undersocialized conduct disorder, aggressive type - Plan: propranolol ER (INDERAL LA) 60 MG 24 hr capsule  Autistic disorder - Plan: propranolol ER (INDERAL LA) 60 MG 24 hr capsule    Recommendations for plan of care: The patient's previous Epic records were reviewed. [name] has neither had nor required imaging or lab studies since the last visit. He has remained seizure free since July 2023 and has been generally healthy. He has been approved for [employer] and Dad is working through that process. I will be  happy to complete forms for him to get into a day program if needed. I will otherwise see Brian Huerta back in follow up in 4 months or sooner if needed.   The medication list was reviewed and reconciled. No changes were made in the prescribed medications today. A complete medication list was provided to the patient.  Return in about 4 months (around 09/16/2022).   Allergies as of 05/18/2022   No Known Allergies      Medication List        Accurate as of May 18, 2022 11:59 PM. If you have any questions, ask your nurse or doctor.          STOP taking these medications    hydrOXYzine 50 MG tablet Commonly known as: ATARAX Stopped by: Elveria Rising, NP       TAKE these medications    cetirizine 10 MG tablet Commonly known as: ZYRTEC Take 10 mg by mouth daily.   clomiPRAMINE 75 MG capsule Commonly known as: ANAFRANIL TAKE 2 CAPSULES BY MOUTH ONCE DAILY IN THE MORNING AND 1 AT BEDTIME FOR SEVERE OCD   cloNIDine 0.2 MG tablet Commonly known as: CATAPRES Take 0.2 mg by mouth 2 (two) times daily. Bedtime only (8:15p) if not  resting within an hour then he gets second dose.   gabapentin 600 MG tablet Commonly known as: NEURONTIN TAKE 1 TABLET BY MOUTH 4 TIMES DAILY AT 6AM,  10AM, 12:30PM, AND 5PM   Lacosamide 100 MG Tabs Commonly known as: Vimpat Take 2 tablets (200 mg total) by mouth 2 (two) times daily.   metFORMIN 500 MG tablet Commonly known as: GLUCOPHAGE Take 500 mg by mouth daily.   propranolol ER 60 MG 24 hr capsule Commonly known as: INDERAL LA Take 1 capsule (60 mg total) by mouth every morning.   risperiDONE 3 MG tablet Commonly known as: RISPERDAL Take 3 mg by mouth 2 (two) times daily.   topiramate 200 MG tablet Commonly known as: TOPAMAX Take 1 tablet (200 mg total) by mouth 2 (two) times daily.   Valtoco 20 MG Dose 2 x 10 MG/0.1ML Lqpk Generic drug: diazePAM (20 MG Dose) Place 1 spray into each nostril for seizure lasting more than 2  minutes      Total time spent with the patient was 10 minutes, of which 50% or more was spent in counseling and coordination of care.  Elveria Rising NP-C [facility] Ph. [phone] Fax [phone]

## 2022-05-22 ENCOUNTER — Encounter (INDEPENDENT_AMBULATORY_CARE_PROVIDER_SITE_OTHER): Payer: Self-pay | Admitting: Family

## 2022-07-28 ENCOUNTER — Telehealth (INDEPENDENT_AMBULATORY_CARE_PROVIDER_SITE_OTHER): Payer: Self-pay | Admitting: Family

## 2022-07-28 NOTE — Telephone Encounter (Signed)
Dad sent me the following email (copy/paste): He had convulsive seizure about 155pm, convulsions lasted a minute and then he just lay on the floor with his arms extended, hands curled for another 2 mins and he came out of it.  No Valtoco needed, he did not soil himself.  However, he has a bump on back of his head and a small laceration on the bump.  The bleeding has stopped and it would be more traumatic to take him to urgent care than to just monitor him.  He fell between his bed and the dresser and I believe hit his head on the edge of the bed frame after he was already on the ground.  His head was hitting the bed frame over and over from the convulsions when I got to him, he was on his back.  I pulled him out from between the bed and the dresser. He didn't start bleeding until after he was laying still but he hadn't come totally out of it yet.  He is hydrated and in relatively good health, no cold symptoms, his sinuses are actually decent right now.  He does ask for his mom incessantly again.  He was demanding her a few minutes before he had the seizure so I would call this psychosomatic again.  He starts a day program next Tuesday with [facility] at their [employer] location, working with retired Pharmacist, hospital, Forde Dandy.  [facility] has rehired my older son Wille Glaser and his girlfriend Latanya Presser to assist [place] with [place] as that's who has been watching him for [place] until the [employer] starts next week.  They will be on an as needed basis or maybe respite.  He is not sleeping now as he often does after a seizure.  This was a mild one other than his head hitting the bed frame.  He is alert and sitting on his bed with his Ipad and watching a Doctor, hospital.  He definitely is kinda in a daze but he is responding to questions and he lets me check on his head and I was able to put pressure on it earlier to make sure the bleeding stopped.  It has.  He also isn't wincing when I touch the bump.  He seems ok. (End  copy/paste)

## 2022-09-02 ENCOUNTER — Telehealth (INDEPENDENT_AMBULATORY_CARE_PROVIDER_SITE_OTHER): Payer: Self-pay | Admitting: Family

## 2022-09-02 ENCOUNTER — Encounter (INDEPENDENT_AMBULATORY_CARE_PROVIDER_SITE_OTHER): Payer: Self-pay | Admitting: Family

## 2022-09-02 NOTE — Progress Notes (Signed)
Opened in error. TG

## 2022-09-02 NOTE — Telephone Encounter (Signed)
Dad contacted me to request a letter for Brian Huerta for a seizure action plan for [place]. I will write the letter and send it to Dad.   Dad also asked about giving Damyen something after a seizure for elevated heart rate. He said that with the seizure in January, the heart rate was in the 120's for about 45 min after the seizure. He believes that it was from anxiety related to the seizure but wondered if he could be given Propranolol 105m or Clonidine when this occurs to help him to relax and lower the heart rate. TG

## 2022-12-13 ENCOUNTER — Other Ambulatory Visit (INDEPENDENT_AMBULATORY_CARE_PROVIDER_SITE_OTHER): Payer: Self-pay | Admitting: Family

## 2022-12-13 DIAGNOSIS — R569 Unspecified convulsions: Secondary | ICD-10-CM

## 2022-12-13 NOTE — Telephone Encounter (Signed)
Call to dad advised received a refill request but he needed a follow up appt in March. Offered 12/22/2022 appt. He prefers it to be virtual and in the afternoon. RN advised her next appts are in July. RN will send message to provider to determine if virtual is ok and if there is a time she could work him in.  Dad reports he will be out of town from tomorrow until June 3 and Markie will not be with him. He prefers virtual because he has had to miss a lot of work with his health prob as well as [place]'s.  Last appt virtual 05/18/2022 Rx filled 06/22/22 with 5 refills.

## 2022-12-13 NOTE — Telephone Encounter (Signed)
I will send a message to the scheduler to contact Dad to schedule a virtual visit. TG

## 2022-12-19 ENCOUNTER — Other Ambulatory Visit (INDEPENDENT_AMBULATORY_CARE_PROVIDER_SITE_OTHER): Payer: Self-pay | Admitting: Family

## 2022-12-19 DIAGNOSIS — R569 Unspecified convulsions: Secondary | ICD-10-CM

## 2022-12-27 ENCOUNTER — Other Ambulatory Visit (INDEPENDENT_AMBULATORY_CARE_PROVIDER_SITE_OTHER): Payer: Self-pay | Admitting: Family

## 2022-12-27 DIAGNOSIS — F840 Autistic disorder: Secondary | ICD-10-CM

## 2022-12-27 DIAGNOSIS — F911 Conduct disorder, childhood-onset type: Secondary | ICD-10-CM

## 2023-02-09 ENCOUNTER — Telehealth (INDEPENDENT_AMBULATORY_CARE_PROVIDER_SITE_OTHER): Payer: Medicare Other | Admitting: Family

## 2023-02-09 ENCOUNTER — Encounter (INDEPENDENT_AMBULATORY_CARE_PROVIDER_SITE_OTHER): Payer: Self-pay | Admitting: Family

## 2023-02-09 VITALS — Wt 234.0 lb

## 2023-02-09 DIAGNOSIS — F6381 Intermittent explosive disorder: Secondary | ICD-10-CM

## 2023-02-09 DIAGNOSIS — G40909 Epilepsy, unspecified, not intractable, without status epilepticus: Secondary | ICD-10-CM | POA: Insufficient documentation

## 2023-02-09 DIAGNOSIS — F911 Conduct disorder, childhood-onset type: Secondary | ICD-10-CM

## 2023-02-09 DIAGNOSIS — F840 Autistic disorder: Secondary | ICD-10-CM | POA: Diagnosis not present

## 2023-02-09 DIAGNOSIS — R569 Unspecified convulsions: Secondary | ICD-10-CM

## 2023-02-09 MED ORDER — BRIVIACT 25 MG PO TABS
ORAL_TABLET | ORAL | 1 refills | Status: DC
Start: 2023-02-09 — End: 2023-03-28

## 2023-02-09 NOTE — Patient Instructions (Signed)
It was a pleasure to see you today!  Instructions for you until your next appointment are as follows: Start Briviact 25mg  at bedtime for 2 weeks.  Email or call in 2 weeks to let me know how things are going. If he is tolerating the medication, we will increase to 1 tablet twice per day Continue the other medications as prescribed.  Call for questions or concerns Please sign up for MyChart if you have not done so. Please plan to return for follow up in November or sooner if needed.  Feel free to contact our office during normal business hours at [phone] with questions or concerns. If there is no answer or the call is outside business hours, please leave a message and our clinic staff will call you back within the next business day.  If you have an urgent concern, please stay on the line for our after-hours answering service and ask for the on-call neurologist.     I also encourage you to use MyChart to communicate with me more directly. If you have not yet signed up for MyChart within [facility], the front desk staff can help you. However, please note that this inbox is NOT monitored on nights or weekends, and response can take up to 2 business days.  Urgent matters should be discussed with the on-call pediatric neurologist.   At [facility], we are committed to providing exceptional care. You will receive a patient satisfaction survey through text or email regarding your visit today. Your opinion is important to me. Comments are appreciated.

## 2023-02-09 NOTE — Progress Notes (Signed)
This is a Pediatric Specialist E-Visit consult/follow up provided via My Chart Video Visit (Caregility). Brian Huerta and his father Allena Earing consented to an E-Visit consult today.  Is the patient present for the video visit? Yes Location of patient: Brian Huerta is at home. Is the patient located in the state of [place]? Yes Location of provider: Elveria Rising, NP is at office Patient was referred by Norm Salt, PA   The following participants were involved in this E-Visit: CMA, NP, patient's father  This visit was done via VIDEO   Chief Complain/ Reason for E-Visit today: seizures Total time on call: 20 min Follow up: in November    Brian Huerta   MRN:  [id number]  Nov 11, 2000   Provider: Elveria Rising NP-C Location of Care: [facility]  Visit type: Return visit  Last visit: 05/18/2022  Referral source: Norm Salt, PA History from: Epic chart and patient's father  Brief history:  Copied from previous record:   Today's concerns: Has had 10 generalized tonic-clonic seizures since his last visit in November 2023, with the last 2 occurring this month Dad also describes episodes that occur each day in which Brian Huerta stops, turns his head upward, stares and has choreatic movements of his right arm. He does this several times per day. During the episodes he can sometimes answer when his name is called but is unable to stop the behavior. This lasts seconds to minutes. When the behavior stops, he returns to his previous activity.  Jden had a caregiver for awhile that helped with his behavior but then he became aggressive with her and she resigned. Dad is looking for a new caregiver for Brian Huerta but feels that the experience was beneficial overall and that [place] has been calmer since then.   Celester has a good appetite and generally sleeps well.  [place]ala[place]ias has been otherwise generally healthy since he  was last seen. No health concerns today other than previously mentioned.  Review of systems: Please see HPI for neurologic and other pertinent review of systems. Otherwise all other systems were reviewed and were negative.  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   Autistic disorder 09/30/2012   Morbid obesity (HCC) 09/30/2012   Intermittent explosive disorder 09/30/2012     Past Medical History:  Diagnosis Date   Anxiety    Phreesia 02/02/2020   Autism    Depression    Phreesia 02/02/2020   Prediabetes    Seizures (HCC)     Past medical history comments: See HPI  Surgical history: Past Surgical History:  Procedure Laterality Date   TURBINATE REDUCTION      Family history: family history includes Anxiety disorder in his mother; Bipolar disorder in his brother; Depression in his mother; Hypothyroidism in his mother; Migraines in his maternal grandmother; Sleep apnea in his father.   Social history: Social History   Socioeconomic History   Marital status: Single    Spouse name: Not on file   Number of children: 0   Years of education: Not on file   Highest education level: Not on file  Occupational History   Not on file  Tobacco Use   Smoking status: Never    Passive exposure: Yes   Smokeless tobacco: Never  Vaping Use   Vaping status: Never Used  Substance and Sexual Activity  Alcohol use: Not Currently   Drug use: Not Currently   Sexual activity: Not on file  Other Topics Concern   Not on file  Social History Narrative   He lives with his father and step mother.    He has 1 older brother and a sibling that was given up for adoption.    Brian Huerta's mother passed away unexpectedly on October 18, 2020.   Social Determinants of Health   Financial Resource Strain: Not on file  Food Insecurity: Not on file  Transportation Needs: Not on file   Physical Activity: Not on file  Stress: Not on file  Social Connections: Not on file  Intimate Partner Violence: Not on file    Past/failed meds: Copied from previous record: Zyprexa (paradoxical effect), ativan (paradoxical effect), valium (paradoxical effect).  Tried SSRIs, unsure what, "paradoxical response"   Allergies: No Known Allergies   Immunizations:  There is no immunization history on file for this patient.   Diagnostics/Screenings:  Physical Exam: Wt 234 lb (106.1 kg)   BMI 33.10 kg/m   Examination was limited by video format General: well developed, well nourished young man, active in his home, in no evident distress Head: normocephalic and atraumatic. No dysmorphic features. Neck: supple Musculoskeletal: no skeletal deformities or obvious scoliosis.  Skin: no rashes or neurocutaneous lesions  Neurologic Exam Mental Status: awake and fully alert. Has no language.  Unable to follow instructions or participate in examination. Poorly attentive to the video. Cranial Nerves: turns to localize faces and objects in the periphery. Turns to localize sounds in the periphery. Facial movements are symmetric. Motor: normal functional bulk, tone and strength Sensory: withdrawal x 4 Coordination: unable to adequately assess due to patient's inability to participate in examination. No dysmetria when reaching for objects. Gait and Station: able to walk independently   Impression: Seizure disorder (HCC) - Plan: BRIVIACT 25 MG TABS tablet  Convulsions, unspecified convulsion type (HCC) - Plan: BRIVIACT 25 MG TABS tablet  Autistic disorder  Undersocialized conduct disorder, aggressive type  Intermittent explosive disorder   Recommendations for plan of care: The patient's previous Epic records were reviewed. No recent diagnostic studies to be reviewed with the patient. I talked with Dad about Brian Huerta's seizures since November and about the daily behaviors that may represent  seizures. I am concerned that Gerry may be having more seizures than we know about. He would not be able to cooperate with an EEG, and is already taking Vimpat and Topiramate without good seizure control. I recommended adding Briviact to his regimen as his seizures seem to be partial onset in nature. I am reluctant to start him on Levetiracetam because of his problems with mood and behavior, and am hopeful that Briviact will be a better option for him. If this does not work out, the next medication I will consider will be Onfi. I reviewed these options with Dad and he agreed with this plan.  Plan until next visit: Start Briviact 25mg  at bedtime for 2 weeks, then increase to 1 tablet twice per day Report in 2 weeks how he is doing Continue other medications as prescribed  Call for questions or concerns Follow up in November 2024  The medication list was reviewed and reconciled. I reviewed the changes that were made in the prescribed medications today. A complete medication list was provided to the patient.  Allergies as of 02/09/2023   No Known Allergies      Medication List        Accurate  as of February 09, 2023  3:29 PM. If you have any questions, ask your nurse or doctor.          Briviact 25 MG Tabs tablet Generic drug: brivaracetam Give 1 tablet at bedtime for 2 weeks, then give 1 tablet twice per day Started by: Elveria Rising   cetirizine 10 MG tablet Commonly known as: ZYRTEC Take 10 mg by mouth daily.   clomiPRAMINE 75 MG capsule Commonly known as: ANAFRANIL TAKE 2 CAPSULES BY MOUTH ONCE DAILY IN THE MORNING AND 1 AT BEDTIME FOR SEVERE OCD   cloNIDine 0.2 MG tablet Commonly known as: CATAPRES Take 0.2 mg by mouth 2 (two) times daily. Bedtime only (8:15p) if not resting within an hour then he gets second dose.   gabapentin 600 MG tablet Commonly known as: NEURONTIN TAKE 1 TABLET BY MOUTH 4 TIMES DAILY AT 6AM,  10AM, 12:30PM, AND 5PM   hydrOXYzine 50 MG  tablet Commonly known as: ATARAX Take 50 mg by mouth at bedtime.   Lacosamide 100 MG Tabs Take 2 tablets by mouth twice daily   metFORMIN 500 MG tablet Commonly known as: GLUCOPHAGE Take 500 mg by mouth daily.   propranolol ER 60 MG 24 hr capsule Commonly known as: INDERAL LA Take 1 capsule by mouth in the morning   risperiDONE 3 MG tablet Commonly known as: RISPERDAL Take 3 mg by mouth 2 (two) times daily.   topiramate 200 MG tablet Commonly known as: TOPAMAX Take 1 tablet (200 mg total) by mouth 2 (two) times daily.   Valtoco 20 MG Dose 10 MG/0.1ML Lqpk Generic drug: diazePAM (20 MG Dose) PLACE 1 SPRAY  IN EACH NOSTRIL FOR  SEIZURE  LASTING  MORE  THAN  2  MINUTES      Total time spent with the patient was 20 minutes, of which 50% or more was spent in counseling and coordination of care.  Elveria Rising NP-C [place] [facility] [address], [address] [place], [place] [address] Ph. [phone] Fax [phone]

## 2023-03-01 ENCOUNTER — Other Ambulatory Visit (INDEPENDENT_AMBULATORY_CARE_PROVIDER_SITE_OTHER): Payer: Self-pay | Admitting: Family

## 2023-03-01 DIAGNOSIS — F840 Autistic disorder: Secondary | ICD-10-CM

## 2023-03-01 DIAGNOSIS — F911 Conduct disorder, childhood-onset type: Secondary | ICD-10-CM

## 2023-03-21 ENCOUNTER — Other Ambulatory Visit (INDEPENDENT_AMBULATORY_CARE_PROVIDER_SITE_OTHER): Payer: Self-pay | Admitting: Family

## 2023-03-21 DIAGNOSIS — R569 Unspecified convulsions: Secondary | ICD-10-CM

## 2023-03-28 ENCOUNTER — Telehealth (INDEPENDENT_AMBULATORY_CARE_PROVIDER_SITE_OTHER): Payer: Self-pay | Admitting: Family

## 2023-03-28 DIAGNOSIS — G40909 Epilepsy, unspecified, not intractable, without status epilepticus: Secondary | ICD-10-CM

## 2023-03-28 MED ORDER — BRIVIACT 50 MG PO TABS
50.0000 mg | ORAL_TABLET | Freq: Two times a day (BID) | ORAL | 5 refills | Status: DC
Start: 2023-03-28 — End: 2023-06-05

## 2023-03-28 NOTE — Telephone Encounter (Signed)
Dad contacted me to report that Brian Huerta had a seizure on Sunday 9/8 that lasted 90 seconds. He had full body convulsions and was incontinent of urine. Afterwards he had some confusion but otherwise recovered quickly. I recommended increasing the Briviact dose to 50mg  BID and send in a prescription for that. TG

## 2023-04-20 ENCOUNTER — Other Ambulatory Visit (INDEPENDENT_AMBULATORY_CARE_PROVIDER_SITE_OTHER): Payer: Self-pay | Admitting: Family

## 2023-04-20 DIAGNOSIS — F6381 Intermittent explosive disorder: Secondary | ICD-10-CM

## 2023-05-28 NOTE — Patient Instructions (Incomplete)
It was a pleasure to see you today!  Instructions for you until your next appointment are as follows: Change Gabapentin dose to 600mg  3 times at 6:00AM, 12:00PM and 5:00PM Change Hydroxyzine 50mg  to 12:00PM and bedtime Continue other medications as prescribed Continue to let me know when seizures occur Please sign up for MyChart if you have not done so. Please plan to return for follow up in 3 months or sooner if needed.  Feel free to contact our office during normal business hours at [phone] with questions or concerns. If there is no answer or the call is outside business hours, please leave a message and our clinic staff will call you back within the next business day.  If you have an urgent concern, please stay on the line for our after-hours answering service and ask for the on-call neurologist.     I also encourage you to use MyChart to communicate with me more directly. If you have not yet signed up for MyChart within [facility], the front desk staff can help you. However, please note that this inbox is NOT monitored on nights or weekends, and response can take up to 2 business days.  Urgent matters should be discussed with the on-call pediatric neurologist.   At [facility], we are committed to providing exceptional care. You will receive a patient satisfaction survey through text or email regarding your visit today. Your opinion is important to me. Comments are appreciated.

## 2023-05-28 NOTE — Progress Notes (Unsigned)
This is a Pediatric Specialist E-Visit consult/follow up provided via My Chart Video Visit (Caregility). Brian Huerta and his father Brian Huerta. Brian Huerta (name of consenting adult) consented to an E-Visit consult today.  Is the patient present for the video visit? Yes Location of patient: Brian Huerta is at home  Is the patient located in the state of [place]? Yes Location of provider: Elveria Rising, NP-C is at office Patient was referred by Brian Salt, PA   The following participants were involved in this E-Visit: CMA, NP, patient's father  This visit was done via VIDEO   Chief Complain/ Reason for E-Visit today: seizures Total time on call: 15 min Follow up: 3 months   Brian Huerta   MRN:  [id number]  04/11/2001   Provider: Elveria Rising NP-C Location of Care: [facility]  Visit type: Return visit  Last visit: 02/09/2023  Referral source: Brian Salt, PA History from: Epic chart and patient's father   Brief history:  Copied from previous record: History of autism, problems with behaviors, morbid obesity and seizure disorder. He is taking and tolerating Vimpat and Topiramate for his seizure disorder. His mother passed away unexpectedly on 10-17-2020, and Brian Huerta's behavior has worsened since then. Brian Huerta is followed by psychiatry for anxiety and behavior.   Today's concerns: He had seizures 03/25/23 and 04/26/2023. Dad feels that the Briviact has helped significantly with seizures.  He had virtual appointment with Brian Huerta recently and Saphris was added as a PRN for outbursts of aggressive behavior. Dad reports that the behavior tends to occur against male caregivers, but that he can also punch walls. He has several broken areas on the walls of his home from Brian Huerta's agitation. Dad said that Brian Huerta was given the first dose of Saphris today. He says that it helped but after a short time, Brian Huerta  returned to restless and agitated behavior.   Dad also reported that Brian Huerta was also trying to get approval from [facility] to change his Clonidine to ER formulation to help with behavior.  He has had problems keeping a caregiver engaged because of his behavior. Brian Huerta has regressed and is once again calling for his mother and acting out more when he is frustrated. FMLA form was completed for Dad last week because of Brian Huerta's care needs.  Dad reports that because of problems keeping a caregiver, that Brian Huerta has not been getting all the doses of Gabapentin and Hydroxyzine each day. He tends to miss the mid-morning and midday doses. Dad has noted that Brian Huerta's behavior also worsens each afternoon. Brian Huerta has been otherwise generally healthy since he was last seen. No health concerns today other than previously mentioned.  Review of systems: Please see HPI for neurologic and other pertinent review of systems. Otherwise all other systems were reviewed and were negative.  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   Autistic disorder 09/30/2012   Morbid obesity (HCC) 09/30/2012   Intermittent explosive disorder 09/30/2012     Past Medical History:  Diagnosis Date   Anxiety    Phreesia 02/02/2020   Autism    Depression    Phreesia 02/02/2020   Prediabetes    Seizures (HCC)     Past medical history comments: See HPI  Surgical history: Past Surgical History:  Procedure Laterality Date   TURBINATE  REDUCTION      Family history: family history includes Anxiety disorder in his mother; Bipolar disorder in his brother; Depression in his mother; Hypothyroidism in his mother; Migraines in his maternal grandmother; Sleep apnea in his father.   Social history: Social History   Socioeconomic History   Marital status: Single     Spouse name: Not on file   Number of children: 0   Years of education: Not on file   Highest education level: Not on file  Occupational History   Not on file  Tobacco Use   Smoking status: Never    Passive exposure: Yes   Smokeless tobacco: Never  Vaping Use   Vaping status: Never Used  Substance and Sexual Activity   Alcohol use: Not Currently   Drug use: Not Currently   Sexual activity: Not on file  Other Topics Concern   Not on file  Social History Narrative   He lives with his father and step mother.    He has 1 older brother and a sibling that was given up for adoption.    Kendry's mother passed away unexpectedly on 10/16/20.   Social Determinants of Health   Financial Resource Strain: Not on file  Food Insecurity: Not on file  Transportation Needs: Not on file  Physical Activity: Not on file  Stress: Not on file  Social Connections: Not on file  Intimate Partner Violence: Not on file    Past/failed meds: Copied from previous record: Zyprexa (paradoxical effect), ativan (paradoxical effect), valium (paradoxical effect).  Tried SSRIs, unsure what, "paradoxical response"   Allergies: No Known Allergies   Immunizations:  There is no immunization history on file for this patient.   Diagnostics/Screenings:  Physical Exam: Wt 234 lb (106.1 kg)   BMI 33.10 kg/m   General: well developed, well nourished young man, active in the background on the video, in no evident distress Head: normocephalic and atraumatic. No dysmorphic features. Neck: supple Musculoskeletal: No skeletal deformities or obvious scoliosis Skin: no rashes or neurocutaneous lesions  Neurologic Exam Mental Status: Awake and fully alert. Attended only once briefly to the video when Dad attempted to gain his attention.Was restless and moving constantly in the video.  Cranial Nerves: Turns to localize faces, objects and sounds in the periphery.  Motor: Normal functional bulk, tone and  strength Coordination: Balance adequate Gait and Station: Normal gait on the video   Impression: Seizure disorder (HCC)  Intermittent explosive disorder - Plan: gabapentin (NEURONTIN) 600 MG tablet, hydrOXYzine (ATARAX) 50 MG tablet  Autistic disorder  Undersocialized conduct disorder, aggressive type   Recommendations for plan of care: The patient's previous Epic records were reviewed. No recent diagnostic studies to be reviewed with the patient.  Plan until next visit: I recommended giving the Gabapentin TID I recommended giving a dose of Hydroxyzine at noon along with the midday dose of Gabapentin.  Continue other medications as prescribed  Continue to let me know when seizures occur Continue to follow closely with Brian Huerta for Izick's behavior  Call for questions or concerns Return in about 3 months (around 08/29/2023).  The medication list was reviewed and reconciled. I reviewed the changes that were made in the prescribed medications today. A complete medication list was provided to the patient.  Allergies as of 05/29/2023   No Known Allergies      Medication List        Accurate as of May 29, 2023  8:29 PM. If you have any questions,  ask your nurse or doctor.          Briviact 50 MG Tabs Generic drug: Brivaracetam Take 50 mg by mouth in the morning and at bedtime.   cetirizine 10 MG tablet Commonly known as: ZYRTEC Take 10 mg by mouth daily.   clomiPRAMINE 75 MG capsule Commonly known as: ANAFRANIL TAKE 2 CAPSULES BY MOUTH ONCE DAILY IN THE MORNING AND 1 AT BEDTIME FOR SEVERE OCD   cloNIDine 0.2 MG tablet Commonly known as: CATAPRES Take 0.2 mg by mouth 2 (two) times daily. Bedtime only (8:15p) if not resting within an hour then he gets second dose.   gabapentin 600 MG tablet Commonly known as: NEURONTIN TAKE 1 TABLET BY MOUTH 3 TIMES DAILY TAKE AT  6:00 AM, 12:00 PM, AND 5:00  PM What changed: additional instructions Changed by: Brian Huerta   hydrOXYzine 50 MG tablet Commonly known as: ATARAX Give 1 tablet at 12:00PM and at bedtime What changed:  how much to take how to take this when to take this additional instructions Changed by: Brian Huerta   Lacosamide 100 MG Tabs Take 2 tablets by mouth twice daily   metFORMIN 500 MG tablet Commonly known as: GLUCOPHAGE Take 500 mg by mouth daily.   propranolol ER 60 MG 24 hr capsule Commonly known as: INDERAL LA Take 1 capsule by mouth in the morning   risperiDONE 3 MG tablet Commonly known as: RISPERDAL Take 3 mg by mouth 2 (two) times daily.   topiramate 200 MG tablet Commonly known as: TOPAMAX Take 1 tablet (200 mg total) by mouth 2 (two) times daily.   Valtoco 20 MG Dose 10 MG/0.1ML Lqpk Generic drug: diazePAM (20 MG Dose) PLACE 1 SPRAY  IN EACH NOSTRIL FOR  SEIZURE  LASTING  MORE  THAN  2  MINUTES      Total time spent with the patient was 15 minutes, of which 50% or more was spent in counseling and coordination of care.  Brian Rising NP-C [facility] [address], [address] [place], [place] [address] Ph. [phone] Fax [phone]

## 2023-05-29 ENCOUNTER — Encounter (INDEPENDENT_AMBULATORY_CARE_PROVIDER_SITE_OTHER): Payer: Self-pay | Admitting: Family

## 2023-05-29 ENCOUNTER — Telehealth (INDEPENDENT_AMBULATORY_CARE_PROVIDER_SITE_OTHER): Payer: Medicare Other | Admitting: Family

## 2023-05-29 VITALS — Wt 234.0 lb

## 2023-05-29 DIAGNOSIS — G40909 Epilepsy, unspecified, not intractable, without status epilepticus: Secondary | ICD-10-CM

## 2023-05-29 DIAGNOSIS — F6381 Intermittent explosive disorder: Secondary | ICD-10-CM

## 2023-05-29 DIAGNOSIS — F911 Conduct disorder, childhood-onset type: Secondary | ICD-10-CM

## 2023-05-29 DIAGNOSIS — F840 Autistic disorder: Secondary | ICD-10-CM | POA: Diagnosis not present

## 2023-05-29 MED ORDER — HYDROXYZINE HCL 50 MG PO TABS
ORAL_TABLET | ORAL | 5 refills | Status: DC
Start: 2023-05-29 — End: 2024-04-11

## 2023-05-29 MED ORDER — GABAPENTIN 600 MG PO TABS
ORAL_TABLET | ORAL | 5 refills | Status: DC
Start: 2023-05-29 — End: 2024-01-15

## 2023-06-05 ENCOUNTER — Other Ambulatory Visit (INDEPENDENT_AMBULATORY_CARE_PROVIDER_SITE_OTHER): Payer: Self-pay | Admitting: Family

## 2023-06-05 ENCOUNTER — Telehealth (INDEPENDENT_AMBULATORY_CARE_PROVIDER_SITE_OTHER): Payer: Self-pay | Admitting: Family

## 2023-06-05 DIAGNOSIS — F911 Conduct disorder, childhood-onset type: Secondary | ICD-10-CM

## 2023-06-05 DIAGNOSIS — F840 Autistic disorder: Secondary | ICD-10-CM

## 2023-06-05 DIAGNOSIS — G40909 Epilepsy, unspecified, not intractable, without status epilepticus: Secondary | ICD-10-CM

## 2023-06-05 MED ORDER — BRIVIACT 100 MG PO TABS
100.0000 mg | ORAL_TABLET | Freq: Two times a day (BID) | ORAL | 5 refills | Status: DC
Start: 2023-06-05 — End: 2023-12-25

## 2023-06-05 NOTE — Telephone Encounter (Signed)
Dad reported that Brian Huerta had a seizure on 06/02/2023. The seizure was convulsive and lasted about 40 seconds. He fell to the floor as part of the seizure but was not injured. I recommended increasing Briviact to 100mg  BID and Dad agreed. New Rx sent in for new dose. TG

## 2023-09-14 ENCOUNTER — Encounter (INDEPENDENT_AMBULATORY_CARE_PROVIDER_SITE_OTHER): Payer: Self-pay | Admitting: Pediatrics

## 2023-09-14 ENCOUNTER — Telehealth (INDEPENDENT_AMBULATORY_CARE_PROVIDER_SITE_OTHER): Payer: Self-pay | Admitting: Family

## 2023-09-14 NOTE — Telephone Encounter (Signed)
 Patient discussed with Inetta Fermo.  He is on maximum dosing of three anti-seizure medications, and has been overall well controlled.  I would like to avoid going up further.  On review of symptoms with Inetta Fermo, I think breakthrough seizures could be due to sleep deprivation with increased awakening for mom.  He could benefit from behavioral support and improved sleep management. Consider trazodone for sleep.  I spoke with director of [facility] for grief counseling and they are willing to try grief treatment with him.    Lorenz Coaster MD MPH

## 2023-09-14 NOTE — Telephone Encounter (Signed)
 Dad contacted me to report that Brian Huerta had a seizure on Feb 18. On that day he was with a caregiver who reported that his eyes fluttered, he had some twitching and then he soiled himself. He was not himself for 30 minutes, then was very tired afterwards.   Then yesterday morning at 4:17AM, Dad heard him making sounds that sounded like repetitive movements, some moaning. Then he got out of bed and walked toward his father. He was undressed and had wet the bed. While he was being showered, he stood with with his mouth open, was not answering when his name was called and was not following instructions that he can usually do, such as "raise your arms". He seemed back to baseline about 20 minutes after the event and went back to sleep.  Dad reports that the seizure on Feb 18th occurred after he had been crying and looking for his mother. He notes that Brian Huerta frequently cries and calls out for his mother between 2 and 5AM.   Dad reports that Brian Huerta has not missed any seizure medicines and has been otherwise generally healthy. He is taking Briviact 100mg  BID, Gabapentin 600mg  TID, Lacosamide 200mg  BID, and Topiramate 200mg  BID.   I will consult with Dr Artis Flock and then get back in touch with Dad. TG

## 2023-09-18 ENCOUNTER — Other Ambulatory Visit (INDEPENDENT_AMBULATORY_CARE_PROVIDER_SITE_OTHER): Payer: Self-pay | Admitting: Family

## 2023-09-18 DIAGNOSIS — R569 Unspecified convulsions: Secondary | ICD-10-CM

## 2023-09-26 ENCOUNTER — Other Ambulatory Visit (INDEPENDENT_AMBULATORY_CARE_PROVIDER_SITE_OTHER): Payer: Self-pay

## 2023-09-26 DIAGNOSIS — F911 Conduct disorder, childhood-onset type: Secondary | ICD-10-CM

## 2023-09-26 DIAGNOSIS — F840 Autistic disorder: Secondary | ICD-10-CM

## 2023-09-26 MED ORDER — PROPRANOLOL HCL ER 60 MG PO CP24: 60.0000 mg | ORAL_CAPSULE | Freq: Every morning | ORAL | 0 refills | Status: DC

## 2023-10-29 ENCOUNTER — Other Ambulatory Visit (INDEPENDENT_AMBULATORY_CARE_PROVIDER_SITE_OTHER): Payer: Self-pay | Admitting: Family

## 2023-10-29 DIAGNOSIS — F911 Conduct disorder, childhood-onset type: Secondary | ICD-10-CM

## 2023-10-29 DIAGNOSIS — F840 Autistic disorder: Secondary | ICD-10-CM

## 2023-10-29 NOTE — Progress Notes (Unsigned)
 Brian Huerta   MRN:  [id number]  [birth year]   This is a Pediatric Specialist E-Visit consult/follow up provided via My Chart Video Visit (Caregility). Brian Huerta and his father Lizabeth Riggs. [name] consented to an E-Visit consult today.  Is the patient present for the video visit? Yes Location of patient: Brian Huerta is at home Is the patient located in the state of [place] ? Yes Location of provider: Lyndol Santee, NP-C is at office Patient was referred by Dianah Fort, PA   The following participants were involved in this E-Visit: CMA, NP, patient's father  This visit was done via VIDEO   Chief Complain/ Reason for E-Visit today: seizure follow up Total time on call: 20 min Follow up: 4 months   Provider: Lyndol Santee NP-C Location of Care: [facility]  Visit type: Return visit  Last visit: 05/29/2023  Referral source: no PCP at this time History from: Epic chart and patient's father  Brief history:  Copied from previous record: History of autism, problems with behaviors, morbid obesity and seizure disorder. He is taking and tolerating Vimpat and Topiramate for his seizure disorder. His mother passed away unexpectedly on 2020/10/15, and Brian Huerta's behavior has worsened since then. Brian Huerta is followed by psychiatry for anxiety and behavior.   Today's concerns: He has gained weight since his last visit. His top weight when his mother died in November 14, 2020 was 286 lbs and Dad believes that he weighs 260 lbs now. Dad believes that [facility] frequently and consumes large portions.  [name] used to take Metformin for presumed prediabetes but Dad reports that he no longer has a PCP to prescribe it. Dad has tried to get him established with an adult care PCP without success Dad notes that Brian Huerta sometimes has urinary incontinence at night for reasons that are unclear Dad notes that Brian Huerta can be "obsessed" with drinking  bottles of water. Recently he consumed 9 - 16 oz bottles in a short period of time. Dad feels that this is behavioral more than thirst Dad reports that Brian Huerta tends to sleep fairly well for a few hours at night, then awakens and is restless from about 2AM-4AM. He will then return to sleep until he is awakened at 6:30AM for medications. Brian Huerta has had 5 seizures since last visit. Dad believes that all were triggered by emotional distress as he continues to cry and search for his deceased mother. Dad contacts me to report when seizures occur. Dad notes that Brian Huerta now has a daytime caregiver that is doing well to redirect him when he begins acting distressed. He feels that Brian Huerta needs near constant attention, physical touch and soothing in order to be more calm. He has Saphris to give as needed for agitation but has not had to administer it very often since the caregiver has been working with them.  Dad asked if I will prescribe Topiramate for him as Dr Akintayo has decided not to continue to prescribe it.  Brian Huerta has been otherwise generally healthy since he was last seen. No health concerns today other than previously mentioned.  Review of systems: Please see HPI for neurologic and other pertinent review of systems. Otherwise all other systems were reviewed and were negative.  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   Autistic disorder 09/30/2012   Morbid obesity (HCC)  09/30/2012   Intermittent explosive disorder 09/30/2012     Past Medical History:  Diagnosis Date   Anxiety    Phreesia 02/02/2020   Autism    Depression    Phreesia 02/02/2020   Prediabetes    Seizures (HCC)     Past medical history comments: See HPI  Surgical history: Past Surgical History:  Procedure Laterality Date   TURBINATE REDUCTION       Family  history: family history includes Anxiety disorder in his mother; Bipolar disorder in his brother; Depression in his mother; Hypothyroidism in his mother; Migraines in his maternal grandmother; Sleep apnea in his father.   Social history: Social History   Socioeconomic History   Marital status: Single    Spouse name: Not on file   Number of children: 0   Years of education: Not on file   Highest education level: Not on file  Occupational History   Not on file  Tobacco Use   Smoking status: Never    Passive exposure: Yes   Smokeless tobacco: Never  Vaping Use   Vaping status: Never Used  Substance and Sexual Activity   Alcohol use: Not Currently   Drug use: Not Currently   Sexual activity: Not on file  Other Topics Concern   Not on file  Social History Narrative   He lives with his father and step mother.    He has 1 older brother and a sibling that was given up for adoption.    Brian Huerta's mother passed away unexpectedly on 2020-10-31.   Social Drivers of Corporate investment banker Strain: Not on file  Food Insecurity: Not on file  Transportation Needs: Not on file  Physical Activity: Not on file  Stress: Not on file  Social Connections: Not on file  Intimate Partner Violence: Not on file    Past/failed meds: Copied from previous record: Zyprexa (paradoxical effect), ativan (paradoxical effect), valium (paradoxical effect).  Tried SSRIs, unsure what, "paradoxical response"    Allergies: No Known Allergies   Immunizations:  There is no immunization history on file for this patient.   Diagnostics/Screenings:  Physical Exam: Wt 260 lb (117.9 kg)   BMI 36.78 kg/m   Wt Readings from Last 3 Encounters:  10/30/23 260 lb (117.9 kg)  05/29/23 234 lb (106.1 kg)  02/09/23 234 lb (106.1 kg)    General: well developed, well nourished obese young man, pacing in his home in the video, in no evident distress Head: normocephalic and atraumatic. No dysmorphic  features. Neck: supple Musculoskeletal: No skeletal deformities or obvious scoliosis Skin: no rashes or neurocutaneous lesions  Neurologic Exam Mental Status: Awake and fully alert. Smiling but poorly attentive to the video. Said "hi" to me when prompted by his father Cranial Nerves: Turns to localize faces, objects and sounds in the periphery. Motor: Normal functional bulk, tone and strength Sensory: Withdrawal x 4 Coordination: No dysmetria with reach for objects Gait and Station: Able to walk independently  Impression: Seizure disorder (HCC)  Intermittent explosive disorder  Autistic disorder  Undersocialized conduct disorder, aggressive type  Convulsions, unspecified convulsion type (HCC)  Morbid obesity (HCC)   Recommendations for plan of care: The patient's previous Epic records were reviewed. No recent diagnostic studies to be reviewed with the patient.  Plan until next visit: Topiramate prescription sent in Continue other medications as prescribed  Referral made to [facility] for in home Primary Care Provider Continue to report when seizures occur Call for questions  or concerns Return in about 4 months (around 02/29/2024).  The medication list was reviewed and reconciled. No changes were made in the prescribed medications today. A complete medication list was provided to the patient.  No orders of the defined types were placed in this encounter.    Allergies as of 10/30/2023   No Known Allergies      Medication List        Accurate as of October 29, 2023 10:17 AM. If you have any questions, ask your nurse or doctor.          Briviact 100 MG Tabs tablet Generic drug: brivaracetam Take 1 tablet (100 mg total) by mouth 2 (two) times daily.   cetirizine 10 MG tablet Commonly known as: ZYRTEC Take 10 mg by mouth daily.   clomiPRAMINE 75 MG capsule Commonly known as: ANAFRANIL TAKE 2 CAPSULES BY MOUTH ONCE DAILY IN THE  MORNING AND 1 AT BEDTIME FOR SEVERE OCD   cloNIDine 0.2 MG tablet Commonly known as: CATAPRES Take 0.2 mg by mouth 2 (two) times daily. Bedtime only (8:15p) if not resting within an hour then he gets second dose.   gabapentin 600 MG tablet Commonly known as: NEURONTIN TAKE 1 TABLET BY MOUTH 3 TIMES DAILY TAKE AT  6:00 AM, 12:00 PM, AND 5:00  PM   hydrOXYzine 50 MG tablet Commonly known as: ATARAX Give 1 tablet at 12:00PM and at bedtime   Lacosamide 100 MG Tabs Take 2 tablets by mouth twice daily   metFORMIN 500 MG tablet Commonly known as: GLUCOPHAGE Take 500 mg by mouth daily.   propranolol ER 60 MG 24 hr capsule Commonly known as: INDERAL LA Take 1 capsule (60 mg total) by mouth every morning.   risperiDONE 3 MG tablet Commonly known as: RISPERDAL Take 3 mg by mouth 2 (two) times daily.   topiramate 200 MG tablet Commonly known as: TOPAMAX Take 1 tablet (200 mg total) by mouth 2 (two) times daily.   Valtoco 20 MG Dose 10 MG/0.1ML Lqpk Generic drug: diazePAM (20 MG Dose) PLACE 1 SPRAY  IN EACH NOSTRIL FOR  SEIZURE  LASTING  MORE  THAN  2  MINUTES      Total time spent with the patient was 20 minutes, of which 50% or more was spent in counseling and coordination of care.  Lyndol Santee NP-C [facility] [address], [address] [place], [place] [address] Ph. [phone] Fax [phone]

## 2023-10-30 ENCOUNTER — Encounter (INDEPENDENT_AMBULATORY_CARE_PROVIDER_SITE_OTHER): Payer: Self-pay | Admitting: Family

## 2023-10-30 ENCOUNTER — Telehealth (INDEPENDENT_AMBULATORY_CARE_PROVIDER_SITE_OTHER): Admitting: Family

## 2023-10-30 VITALS — Wt 260.0 lb

## 2023-10-30 DIAGNOSIS — G40909 Epilepsy, unspecified, not intractable, without status epilepticus: Secondary | ICD-10-CM | POA: Diagnosis not present

## 2023-10-30 DIAGNOSIS — Z87898 Personal history of other specified conditions: Secondary | ICD-10-CM

## 2023-10-30 DIAGNOSIS — F911 Conduct disorder, childhood-onset type: Secondary | ICD-10-CM

## 2023-10-30 DIAGNOSIS — F840 Autistic disorder: Secondary | ICD-10-CM

## 2023-10-30 DIAGNOSIS — Z6836 Body mass index (BMI) 36.0-36.9, adult: Secondary | ICD-10-CM

## 2023-10-30 DIAGNOSIS — N3944 Nocturnal enuresis: Secondary | ICD-10-CM

## 2023-10-30 DIAGNOSIS — F6381 Intermittent explosive disorder: Secondary | ICD-10-CM | POA: Diagnosis not present

## 2023-10-30 DIAGNOSIS — R569 Unspecified convulsions: Secondary | ICD-10-CM

## 2023-10-30 MED ORDER — TOPIRAMATE 200 MG PO TABS: 200.0000 mg | ORAL_TABLET | Freq: Two times a day (BID) | ORAL | 5 refills | Status: DC

## 2023-10-30 NOTE — Patient Instructions (Addendum)
 It was a pleasure to see you today!  Instructions for you until your next appointment are as follows: I sent in Topiramate prescription Continue other medications as prescribed I placed a referral to [facility] for in home Primary Care Please sign up for MyChart if you have not done so. Please plan to return for follow up in 4 months or sooner if needed.  Feel free to contact our office during normal business hours at [phone] with questions or concerns. If there is no answer or the call is outside business hours, please leave a message and our clinic staff will call you back within the next business day.  If you have an urgent concern, please stay on the line for our after-hours answering service and ask for the on-call neurologist.     I also encourage you to use MyChart to communicate with me more directly. If you have not yet signed up for MyChart within [facility], the front desk staff can help you. However, please note that this inbox is NOT monitored on nights or weekends, and response can take up to 2 business days.  Urgent matters should be discussed with the on-call pediatric neurologist.   At [facility], we are committed to providing exceptional care. You will receive a patient satisfaction survey through text or email regarding your visit today. Your opinion is important to me. Comments are appreciated.

## 2023-12-24 ENCOUNTER — Other Ambulatory Visit (INDEPENDENT_AMBULATORY_CARE_PROVIDER_SITE_OTHER): Payer: Self-pay | Admitting: Family

## 2023-12-24 DIAGNOSIS — G40909 Epilepsy, unspecified, not intractable, without status epilepticus: Secondary | ICD-10-CM

## 2024-01-14 ENCOUNTER — Other Ambulatory Visit (INDEPENDENT_AMBULATORY_CARE_PROVIDER_SITE_OTHER): Payer: Self-pay | Admitting: Family

## 2024-01-14 DIAGNOSIS — F6381 Intermittent explosive disorder: Secondary | ICD-10-CM

## 2024-01-20 ENCOUNTER — Other Ambulatory Visit (INDEPENDENT_AMBULATORY_CARE_PROVIDER_SITE_OTHER): Payer: Self-pay | Admitting: Family

## 2024-01-20 DIAGNOSIS — F911 Conduct disorder, childhood-onset type: Secondary | ICD-10-CM

## 2024-01-20 DIAGNOSIS — F840 Autistic disorder: Secondary | ICD-10-CM

## 2024-02-16 ENCOUNTER — Other Ambulatory Visit (INDEPENDENT_AMBULATORY_CARE_PROVIDER_SITE_OTHER): Payer: Self-pay | Admitting: Family

## 2024-02-16 DIAGNOSIS — F911 Conduct disorder, childhood-onset type: Secondary | ICD-10-CM

## 2024-02-16 DIAGNOSIS — F840 Autistic disorder: Secondary | ICD-10-CM

## 2024-03-12 ENCOUNTER — Other Ambulatory Visit (INDEPENDENT_AMBULATORY_CARE_PROVIDER_SITE_OTHER): Payer: Self-pay | Admitting: Family

## 2024-03-12 DIAGNOSIS — R569 Unspecified convulsions: Secondary | ICD-10-CM

## 2024-04-10 NOTE — Progress Notes (Signed)
 This is a Pediatric Specialist E-Visit consult/follow up provided via My Chart Video Visit (Caregility). Brian Huerta and his father Brian Huerta. Brian Huerta consented to an E-Visit consult today.  Is the patient present for the video visit? yes Location of patient: [name] is at home  Is the patient located in the state of [place] ? yes Location of provider: Ellouise Bollman, NP-C is at office Patient was referred by [facility]   The following participants were involved in this E-Visit: CMA, NP, patient's father   This visit was done via VIDEO   Chief Complain/ Reason for E-Visit today: seizure follow up Total time on call: 20 min Follow up: 6 months   Brian Huerta   MRN:  [id number]  2001/05/15   Provider: Ellouise Bollman NP-C Location of Care: [facility]  Visit type: Return visit  Last visit: 10/30/2023  Referral source: [facility] History from: Epic chart and patient's father  Brief history:  Copied from previous record: History of autism, problems with behaviors, morbid obesity and seizure disorder. He is taking and tolerating Vimpat  and Topiramate  for his seizure disorder. His mother passed away unexpectedly on 11-03-2020, and Brian Huerta's behavior has worsened since then. Link is followed by psychiatry for anxiety and behavior. He is followed by [facility] for home based primary care.  Today's concerns: Brian Huerta has had one convulsive seizure since his last visit. That occurred on a day that he drank excessive amounts of water. He has periodic focal seizures that do not result in loss of consciousness. Dad describes these as staring with loss of awareness then returning to activities. He sometimes has loss of urine or stool that may also be related to seizure activity Dad reports that he and step-Mom are now paid caregivers for Brian Huerta. This has worked much better than caregivers  hired in the past.  Brian Huerta has aged out of his pediatric dentistry practice and Dad is looking for an adult special needs dentist for him. Dad reports that Brian Huerta continues to experience erratic sleep, despite efforts at sleep hygiene and medications.  Dad is experiencing chronic health problems and will need updated FMLA soon Brian Huerta has been otherwise generally healthy since he was last seen. No health concerns today other than previously mentioned.  Review of systems: Please see HPI for neurologic and other pertinent review of systems. Otherwise all other systems were reviewed and were negative.  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   Autistic disorder 09/30/2012   Morbid obesity (HCC) 09/30/2012   Intermittent explosive disorder 09/30/2012     Past Medical History:  Diagnosis Date   Anxiety    Phreesia 02/02/2020   Autism    Depression    Phreesia 02/02/2020   Prediabetes    Seizures (HCC)     Past medical history comments: See HPI  Surgical history: Past Surgical History:  Procedure Laterality Date   TURBINATE REDUCTION      Family history: family history includes Anxiety disorder in his mother; Bipolar disorder in his brother; Depression in his mother; Hypothyroidism in his mother; Migraines in his maternal grandmother; Sleep apnea in his father.   Social history: Social History   Socioeconomic History   Marital status: Single    Spouse name: Not on file   Number of children: 0   Years of  education: Not on file   Highest education level: Not on file  Occupational History   Not on file  Tobacco Use   Smoking status: Never    Passive exposure: Yes   Smokeless tobacco: Never  Vaping Use   Vaping status: Never Used  Substance and Sexual Activity   Alcohol use: Not Currently   Drug use: Not Currently    Sexual activity: Not on file  Other Topics Concern   Not on file  Social History Narrative   He lives with his father and step mother.    He has 1 older brother and a sibling that was given up for adoption.    Brian Huerta's mother passed away unexpectedly on 10/27/2020.   1 dog   Social Drivers of Corporate investment banker Strain: Not on file  Food Insecurity: Not on file  Transportation Needs: Not on file  Physical Activity: Not on file  Stress: Not on file  Social Connections: Not on file  Intimate Partner Violence: Not on file    Past/failed meds: Copied from previous record: Zyprexa (paradoxical effect), ativan (paradoxical effect), valium (paradoxical effect).  Tried SSRIs, unsure what, paradoxical response   Allergies: No Known Allergies   Immunizations:  There is no immunization history on file for this patient.   Diagnostics/Screenings:  Physical Exam: Wt 254 lb 12.8 oz (115.6 kg)   BMI 36.04 kg/m   Wt Readings from Last 3 Encounters:  04/11/24 254 lb 12.8 oz (115.6 kg)  10/30/23 260 lb (117.9 kg)  05/29/23 234 lb (106.1 kg)  Examination limited by video format  General: well developed, well nourished young man, active in his home, in no evident distress Head: normocephalic and atraumatic. No dysmorphic features. Neck: supple Musculoskeletal: No skeletal deformities or obvious scoliosis Skin: no rashes or neurocutaneous lesions  Neurologic Exam Mental Status: Awake and fully alert. Pays little attention to the video visit. Yelling and striking objects in his home. Cranial Nerves: Turns to localize faces, objects and sounds in the periphery. Motor: Normal functional bulk, tone and strength Coordination: No dysmetria with reach for objects Gait and Station: Normal gait  Impression: Convulsions, unspecified convulsion type (HCC) - Plan: Lacosamide  100 MG TABS, topiramate  (TOPAMAX ) 200 MG tablet  Seizure disorder (HCC) - Plan: BRIVIACT  100 MG TABS  tablet, topiramate  (TOPAMAX ) 200 MG tablet  Intermittent explosive disorder - Plan: gabapentin  (NEURONTIN ) 600 MG tablet, hydrOXYzine  (ATARAX ) 50 MG tablet  Undersocialized conduct disorder, aggressive type - Plan: propranolol  ER (INDERAL  LA) 60 MG 24 hr capsule  Autistic disorder - Plan: propranolol  ER (INDERAL  LA) 60 MG 24 hr capsule  Morbid obesity (HCC)  Nocturnal enuresis   Recommendations for plan of care: The patient's previous Epic records were reviewed. No recent diagnostic studies to be reviewed with the patient.  Plan until next visit: Continue medications as prescribed  Will email options for adult special needs dentist to Dad Will complete FMLA form for Dad when needed Call for questions or concerns Return in about 6 months (around 10/09/2024).  The medication list was reviewed and reconciled. No changes were made in the prescribed medications today. A complete medication list was provided to the patient.  Allergies as of 04/11/2024   No Known Allergies      Medication List        Accurate as of April 11, 2024 11:59 PM. If you have any questions, ask your nurse or doctor.          asenapine 5  MG Subl 24 hr tablet Commonly known as: SAPHRIS Place under the tongue.   Briviact  100 MG Tabs tablet Generic drug: brivaracetam  Take 1 tablet (100 mg total) by mouth 2 (two) times daily.   clomiPRAMINE 75 MG capsule Commonly known as: ANAFRANIL TAKE 2 CAPSULES BY MOUTH ONCE DAILY IN THE MORNING AND 1 AT BEDTIME FOR SEVERE OCD   cloNIDine  0.2 MG tablet Commonly known as: CATAPRES  Take 0.2 mg by mouth 2 (two) times daily. Bedtime only (8:15p) if not resting within an hour then he gets second dose.   gabapentin  600 MG tablet Commonly known as: NEURONTIN  TAKE ONE TABLET BY MOUTH 3 TIMES DAILY AT 6AM, 12PM AND 5PM   hydrOXYzine  50 MG tablet Commonly known as: ATARAX  Give 1 tablet at 12:00PM and at bedtime   Lacosamide  100 MG Tabs Take 2 tablets (200 mg  total) by mouth 2 (two) times daily.   metFORMIN 500 MG tablet Commonly known as: GLUCOPHAGE Take 500 mg by mouth daily.   propranolol  ER 60 MG 24 hr capsule Commonly known as: INDERAL  LA Take 1 capsule (60 mg total) by mouth every morning.   risperiDONE 3 MG tablet Commonly known as: RISPERDAL TAKE 1 TABLET BY MOUTH TWICE DAILY FOR AGITATION   topiramate  200 MG tablet Commonly known as: TOPAMAX  Take 1 tablet (200 mg total) by mouth 2 (two) times daily.   Valtoco  20 MG Dose 10 MG/0.1ML Lqpk Generic drug: diazePAM  (20 MG Dose) PLACE 1 SPRAY  IN EACH NOSTRIL FOR  SEIZURE  LASTING  MORE  THAN  2  MINUTES      Total time spent with the patient was 25 minutes, of which 50% or more was spent in counseling and coordination of care.  Ellouise Bollman NP-C [facility] [address], [address] [place], [place] [address] Ph. [phone] Fax [phone]

## 2024-04-11 ENCOUNTER — Encounter (INDEPENDENT_AMBULATORY_CARE_PROVIDER_SITE_OTHER): Payer: Self-pay | Admitting: Family

## 2024-04-11 ENCOUNTER — Telehealth (INDEPENDENT_AMBULATORY_CARE_PROVIDER_SITE_OTHER): Admitting: Family

## 2024-04-11 VITALS — Wt 254.8 lb

## 2024-04-11 DIAGNOSIS — F6381 Intermittent explosive disorder: Secondary | ICD-10-CM

## 2024-04-11 DIAGNOSIS — F911 Conduct disorder, childhood-onset type: Secondary | ICD-10-CM

## 2024-04-11 DIAGNOSIS — N3944 Nocturnal enuresis: Secondary | ICD-10-CM

## 2024-04-11 DIAGNOSIS — G40909 Epilepsy, unspecified, not intractable, without status epilepticus: Secondary | ICD-10-CM | POA: Diagnosis not present

## 2024-04-11 DIAGNOSIS — F840 Autistic disorder: Secondary | ICD-10-CM | POA: Diagnosis not present

## 2024-04-11 DIAGNOSIS — R569 Unspecified convulsions: Secondary | ICD-10-CM

## 2024-04-11 DIAGNOSIS — Z6836 Body mass index (BMI) 36.0-36.9, adult: Secondary | ICD-10-CM

## 2024-04-11 MED ORDER — TOPIRAMATE 200 MG PO TABS: 200.0000 mg | ORAL_TABLET | Freq: Two times a day (BID) | ORAL | 5 refills | Status: AC

## 2024-04-11 MED ORDER — PROPRANOLOL HCL ER 60 MG PO CP24
60.0000 mg | ORAL_CAPSULE | Freq: Every morning | ORAL | 5 refills | Status: AC
Start: 2024-04-11 — End: ?

## 2024-04-11 MED ORDER — BRIVIACT 100 MG PO TABS: 100.0000 mg | ORAL_TABLET | Freq: Two times a day (BID) | ORAL | 5 refills | Status: AC

## 2024-04-11 MED ORDER — HYDROXYZINE HCL 50 MG PO TABS: ORAL_TABLET | ORAL | 5 refills | Status: AC

## 2024-04-11 MED ORDER — LACOSAMIDE 100 MG PO TABS: 2.0000 | ORAL_TABLET | Freq: Two times a day (BID) | ORAL | 5 refills | Status: AC

## 2024-04-11 MED ORDER — GABAPENTIN 600 MG PO TABS: ORAL_TABLET | ORAL | 5 refills | Status: AC

## 2024-04-11 NOTE — Patient Instructions (Addendum)
 It was a pleasure to see you today!  Instructions for you until your next appointment are as follows: Continue Brian Huerta's medications as prescribed I will email an option to you for an adult special needs dentist Send me the FMLA form when you have it Please sign up for MyChart if you have not done so. Please plan to return for follow up in 6 months or sooner if needed.  Feel free to contact our office during normal business hours at [phone] with questions or concerns. If there is no answer or the call is outside business hours, please leave a message and our clinic staff will call you back within the next business day.  If you have an urgent concern, please stay on the line for our after-hours answering service and ask for the on-call neurologist.     I also encourage you to use MyChart to communicate with me more directly. If you have not yet signed up for MyChart within [facility], the front desk staff can help you. However, please note that this inbox is NOT monitored on nights or weekends, and response can take up to 2 business days.  Urgent matters should be discussed with the on-call pediatric neurologist.   At [facility], we are committed to providing exceptional care. You will receive a patient satisfaction survey through text or email regarding your visit today. Your opinion is important to me. Comments are appreciated.

## 2024-04-14 ENCOUNTER — Encounter (INDEPENDENT_AMBULATORY_CARE_PROVIDER_SITE_OTHER): Payer: Self-pay | Admitting: Family

## 2024-04-14 DIAGNOSIS — N3944 Nocturnal enuresis: Secondary | ICD-10-CM | POA: Insufficient documentation
